# Patient Record
Sex: Male | Born: 1955 | Race: White | Hispanic: No | Marital: Single | Smoking: Never smoker
Health system: Southern US, Community
[De-identification: ages and names within clinical notes are randomized; demographics above are authoritative.]

## PROBLEM LIST (undated history)

## (undated) DIAGNOSIS — I10 Essential (primary) hypertension: Secondary | ICD-10-CM

## (undated) HISTORY — PX: TONSILLECTOMY: SUR1361

---

## 2012-10-17 ENCOUNTER — Emergency Department (INDEPENDENT_AMBULATORY_CARE_PROVIDER_SITE_OTHER): Payer: Self-pay

## 2012-10-17 ENCOUNTER — Encounter: Payer: Self-pay | Admitting: *Deleted

## 2012-10-17 ENCOUNTER — Emergency Department
Admission: EM | Admit: 2012-10-17 | Discharge: 2012-10-17 | Disposition: A | Discharge status: Home / Self Care | Payer: Self-pay | Source: Home / Self Care | Attending: Family Medicine | Admitting: Family Medicine

## 2012-10-17 DIAGNOSIS — R05 Cough: Secondary | ICD-10-CM

## 2012-10-17 DIAGNOSIS — R5383 Other fatigue: Secondary | ICD-10-CM

## 2012-10-17 DIAGNOSIS — T594X1A Toxic effect of chlorine gas, accidental (unintentional), initial encounter: Secondary | ICD-10-CM

## 2012-10-17 DIAGNOSIS — T594X4A Toxic effect of chlorine gas, undetermined, initial encounter: Secondary | ICD-10-CM

## 2012-10-17 DIAGNOSIS — Z77098 Contact with and (suspected) exposure to other hazardous, chiefly nonmedicinal, chemicals: Secondary | ICD-10-CM

## 2012-10-17 HISTORY — DX: Essential (primary) hypertension: I10

## 2012-10-17 MED ORDER — ALBUTEROL SULFATE HFA 108 (90 BASE) MCG/ACT IN AERS: 2.0000 | INHALATION_SPRAY | RESPIRATORY_TRACT | Status: DC | PRN

## 2012-10-17 MED ORDER — PREDNISONE 50 MG PO TABS: ORAL_TABLET | ORAL | Status: DC

## 2012-10-17 NOTE — ED Provider Notes (Addendum)
History     CSN: [id number]  Arrival date & time 10/17/12  1154   First MD Initiated Contact with Patient 10/17/12 1202      Chief Complaint  Patient presents with  . Sore Throat   HPI  This is a 57 year old male with no prior medical history apart from tonsillectomy and high blood pressure presenting today with multiple symptoms status post chlorine exposure. Patient works in Education officer, environmental. Patient states he had a very high amounts of chlorine vapor exposure about one week ago. Chloride was industrial-strength per patient. Immediately after the exposure, patient states he fell on However within about 24 hours he noticed severe sore throat, headache, generalized malaise, fatigue, mild shortness of breath. Symptoms have persisted over the course of the past week and had failed to improve. Patient reports severe onset of headache as diffuse in nature. Pain is 10 out of 10 and occurs multiple times throughout the day. Patient also reports severe sore throat with mild difficulty swallowing however no difficulty breathing. Patient does have mild cough is now report of any significant wheezing however patient is unsure. Patient is a nonsmoker. Past Medical History  Diagnosis Date  . Hypertension     Past Surgical History  Procedure Laterality Date  . Tonsillectomy      Family History  Problem Relation Age of Onset  . Heart attack Father   . Diabetes Father     History  Substance Use Topics  . Smoking status: Never Smoker   . Smokeless tobacco: Never Used  . Alcohol Use: No      Review of Systems  All other systems reviewed and are negative.    Allergies  Codeine  Home Medications   Current Outpatient Rx  Name  Route  Sig  Dispense  Refill  . lisinopril (PRINIVIL,ZESTRIL) 20 MG tablet   Oral   Take 20 mg by mouth daily.           BP 118/78  Pulse 88  Temp(Src) 98.4 F (36.9 C) (Oral)  Resp 16  Ht 5\' 3"  (1.6 m)  Wt 167 lb (75.751 kg)  BMI 29.59 kg/m2   SpO2 96%  Physical Exam  Constitutional: He appears well-developed and well-nourished.  HENT:  Head: Normocephalic and atraumatic.  Right Ear: External ear normal.  Left Ear: External ear normal.  Eyes: Conjunctivae are normal. Pupils are equal, round, and reactive to light.  Neck: Normal range of motion. Neck supple.  Cardiovascular: Normal rate and regular rhythm.   Pulmonary/Chest: Effort normal and breath sounds normal.  Abdominal: Soft.  Musculoskeletal: Normal range of motion.  Neurological: He is alert.  Skin: Skin is warm.    ED Course  Procedures (including critical care time)  Labs Reviewed - No data to display No results found.   1. Toxic effect of chlorine gas, unintentional, initial encounter       MDM  Discussed case with poison control. Initial management is supportive care. Suspect this is a fairly high level chlorine gas exposure as chlorine was commercial grade. Discussed with patient obtaining and a chest x-ray as well as a head CT. Patient is uninsured and refused these tests. Discussed with patient importance of these tests given chlorine exposure. Patient states he can't afford these tests. Will prescribe prednisone as well as albuterol as this may give some symptomatic improvement. Discussed this with poison control. Discussed with patient that if his symptoms worsen in any regard they will need to be reevaluated for symptoms other here in  the ER. Patient expressed understanding of this.       The patient and/or caregiver has been counseled thoroughly with regard to treatment plan and/or medications prescribed including dosage, schedule, interactions, rationale for use, and possible side effects and they verbalize understanding. Diagnoses and expected course of recovery discussed and will return if not improved as expected or if the condition worsens. Patient and/or caregiver verbalized understanding.               Doree Albee,  MD 10/17/12 1254  Medical update: Patient is now agreeable to getting chest x-ray. Will obtain a followup pending results.  Doree Albee, MD 10/17/12 1307  CXR WNL.  Complete treatment course. Follow up or go to ER if sxs worsen.    Doree Albee, MD 10/17/12 [phone]

## 2012-10-17 NOTE — ED Notes (Signed)
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 Since, he c/o sore throat, HA, fatigue and loss of appetite. Unsure of fever but has felt "hot".

## 2012-10-22 ENCOUNTER — Telehealth: Payer: Self-pay | Admitting: Emergency Medicine

## 2013-03-06 ENCOUNTER — Ambulatory Visit (INDEPENDENT_AMBULATORY_CARE_PROVIDER_SITE_OTHER): Payer: Self-pay | Admitting: Family Medicine

## 2013-03-06 ENCOUNTER — Encounter: Payer: Self-pay | Admitting: Family Medicine

## 2013-03-06 VITALS — BP 124/84 | HR 78 | Ht 63.0 in | Wt 169.0 lb

## 2013-03-06 DIAGNOSIS — I10 Essential (primary) hypertension: Secondary | ICD-10-CM | POA: Insufficient documentation

## 2013-03-06 MED ORDER — LISINOPRIL 20 MG PO TABS: 20.0000 mg | ORAL_TABLET | Freq: Every day | ORAL | Status: DC

## 2013-03-06 NOTE — Progress Notes (Signed)
CC: Joel Matthews is a 57 y.o. 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 After diagnosis of hypertension he has made drastic change in diet cutting out all fast food no longer adding salt to his diet sticking to a bland diet with lean meats.  He has a blood pressure cuff at home but no outside blood pressures to report. He believes lisinopril to help with blood pressure soon after starting.  Review of Systems - General ROS: negative for - chills, fever, night sweats, weight gain or weight loss Ophthalmic ROS: negative for - decreased vision Psychological ROS: negative for - anxiety or depression ENT ROS: negative for - hearing change, nasal congestion, tinnitus or allergies Hematological and Lymphatic ROS: negative for - bleeding problems, bruising or swollen lymph nodes Breast ROS: negative Respiratory ROS: no cough, shortness of breath, or wheezing Cardiovascular ROS: no chest pain or dyspnea on exertion Gastrointestinal ROS: no abdominal pain, change in bowel habits, or black or bloody stools Genito-Urinary ROS: negative for - genital discharge, genital ulcers, incontinence or abnormal bleeding from genitals Musculoskeletal ROS: negative for - joint pain or muscle pain Neurological ROS: negative for - headaches or memory loss Dermatological ROS: negative for lumps, mole changes, rash and skin lesion changes  Past Medical History  Diagnosis Date  . Hypertension      Family History  Problem Relation Age of Onset  . Heart attack Father   . Diabetes Father      History  Substance Use Topics  . Smoking status: Never Smoker   . Smokeless tobacco: Never Used  . Alcohol Use: No     Objective: Filed Vitals:   03/06/13 1100  BP: 124/84  Pulse:  78    General: Alert and Oriented, No Acute Distress HEENT: Pupils equal, round, reactive to light. Conjunctivae clear.  Moist membranes pharynx unremarkable Lungs: Clear to auscultation bilaterally, no wheezing/ronchi/rales.  Comfortable work of breathing. Good air movement. Cardiac: Regular rate and rhythm. Normal S1/S2.  No murmurs, rubs, nor gallops.   Extremities: No peripheral edema.  Strong peripheral pulses.  Mental Status: No depression, anxiety, nor agitation. Skin: Warm and dry.  Assessment & Plan: Joel Matthews was seen today for establish care.  Diagnoses and associated orders for this visit:  Essential hypertension, benign - lisinopril (PRINIVIL,ZESTRIL) 20 MG tablet; Take 1 tablet (20 mg total) by mouth daily.    Essential hypertension: Controlled with diet, manual blood pressure again reports normotensive. We discussed restarting lisinopril if blood pressure climbs above 140/90 but for now do not think he needs to fill this prescription. He will check blood pressure at home periodically. He will return when he has benefits to do routine blood work and preventative health care  Return in about 3 months (around 06/06/2013).

## 2013-06-03 ENCOUNTER — Other Ambulatory Visit: Payer: Self-pay | Admitting: *Deleted

## 2013-06-03 DIAGNOSIS — I10 Essential (primary) hypertension: Secondary | ICD-10-CM

## 2013-06-03 MED ORDER — LISINOPRIL 20 MG PO TABS: 20.0000 mg | ORAL_TABLET | Freq: Every day | ORAL | Status: DC

## 2013-10-30 ENCOUNTER — Telehealth: Payer: Self-pay | Admitting: *Deleted

## 2013-10-30 DIAGNOSIS — I10 Essential (primary) hypertension: Secondary | ICD-10-CM

## 2013-10-30 MED ORDER — LISINOPRIL 20 MG PO TABS: 20.0000 mg | ORAL_TABLET | Freq: Every day | ORAL | Status: DC

## 2013-10-30 NOTE — Telephone Encounter (Signed)
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 Pt wants to know what he can take. Advised him to try otc melatonin first and if no relief to schedule appt. Pt also asked for a refill on BP meds. Told pt we will send in 1 refill but needs to schedule f/u. Pt voiced understanding

## 2014-03-24 ENCOUNTER — Other Ambulatory Visit: Payer: Self-pay | Admitting: Family Medicine

## 2014-06-02 ENCOUNTER — Ambulatory Visit: Payer: Self-pay | Admitting: Family Medicine

## 2014-06-09 ENCOUNTER — Ambulatory Visit (INDEPENDENT_AMBULATORY_CARE_PROVIDER_SITE_OTHER): Payer: Self-pay | Admitting: Family Medicine

## 2014-06-09 ENCOUNTER — Encounter: Payer: Self-pay | Admitting: Family Medicine

## 2014-06-09 VITALS — BP 140/93 | HR 75 | Wt 175.0 lb

## 2014-06-09 DIAGNOSIS — R635 Abnormal weight gain: Secondary | ICD-10-CM

## 2014-06-09 DIAGNOSIS — I10 Essential (primary) hypertension: Secondary | ICD-10-CM

## 2014-06-09 MED ORDER — LISINOPRIL 20 MG PO TABS: 20.0000 mg | ORAL_TABLET | Freq: Every day | ORAL | Status: DC

## 2014-06-09 MED ORDER — PHENTERMINE HCL 37.5 MG PO TABS: 37.5000 mg | ORAL_TABLET | Freq: Every day | ORAL | Status: DC

## 2014-06-09 NOTE — Progress Notes (Signed)
CC: Joel Matthews is a 59 y.o. male is here for Hypertension   Subjective: HPI:  Follow-up essential hypertension: Since I saw him last he noticed blood pressures above 140/90 therefore started taking lisinopril. No outside blood pressures to report after he began this. He's run out of the medication a few days ago. Denies any known side effects while taking the medication. No formal physical activity routine. No chest pain shortness of breath orthopnea peripheral edema nor motor or sensory disturbances  Complains of difficulty with weight loss. He is about to join a gym but notices his biggest problem is portion control. He gives me an example of last week he ate 6 pieces of chicken along with a chicken sandwich and french fries and a single sitting. He reports it's rare that he feels full and he responds to this by continuing to eat.   Review Of Systems Outlined In HPI  Past Medical History  Diagnosis Date  . Hypertension     Past Surgical History  Procedure Laterality Date  . Tonsillectomy     Family History  Problem Relation Age of Onset  . Heart attack Father   . Diabetes Father     History   Social History  . Marital Status: Single    Spouse Name: N/A    Number of Children: N/A  . Years of Education: N/A   Occupational History  . Not on file.   Social History Main Topics  . Smoking status: Never Smoker   . Smokeless tobacco: Never Used  . Alcohol Use: No  . Drug Use: No  . Sexual Activity: Not on file   Other Topics Concern  . Not on file   Social History Narrative     Objective: BP 140/93 mmHg  Pulse 75  Wt 175 lb (79.379 kg)  General: Alert and Oriented, No Acute Distress HEENT: Pupils equal, round, reactive to light. Conjunctivae clear.  Moist because membranes sounds unremarkable Lungs: Clear to auscultation bilaterally, no wheezing/ronchi/rales.  Comfortable work of breathing. Good air movement. Cardiac: Regular rate and rhythm. Normal S1/S2.  No  murmurs, rubs, nor gallops.   Abdomen: Mild obesity Extremities: No peripheral edema.  Strong peripheral pulses.  Mental Status: No depression, anxiety, nor agitation. Skin: Warm and dry.  Assessment & Plan: Joel Matthews was seen today for hypertension.  Diagnoses and associated orders for this visit:  Essential hypertension, benign - lisinopril (PRINIVIL,ZESTRIL) 20 MG tablet; Take 1 tablet (20 mg total) by mouth daily.  Abnormal weight gain - phentermine (ADIPEX-P) 37.5 MG tablet; Take 1 tablet (37.5 mg total) by mouth daily before breakfast.    Essential hypertension: Uncontrolled, restart lisinopril Abnormal weight gain: Provided with phentermine to help with portion control. Urged him to continue and follow through with joining the gym to do moderate physical activity most days a week. Discussed that he'll need to return every 4 weeks for a blood pressure and weight check if he is considering a refill for phentermine. Also discussed that this medication at some point will lose its effectiveness with weight loss at that point will no longer be prescribed.  Return in about 4 weeks (around 07/07/2014) for BP and Weight Check.

## 2014-07-07 ENCOUNTER — Ambulatory Visit (INDEPENDENT_AMBULATORY_CARE_PROVIDER_SITE_OTHER): Payer: Self-pay | Admitting: Family Medicine

## 2014-07-07 ENCOUNTER — Encounter: Payer: Self-pay | Admitting: Family Medicine

## 2014-07-07 VITALS — BP 94/65 | HR 62 | Ht 63.0 in | Wt 169.0 lb

## 2014-07-07 DIAGNOSIS — E669 Obesity, unspecified: Secondary | ICD-10-CM

## 2014-07-07 DIAGNOSIS — R635 Abnormal weight gain: Secondary | ICD-10-CM

## 2014-07-07 DIAGNOSIS — I10 Essential (primary) hypertension: Secondary | ICD-10-CM

## 2014-07-07 MED ORDER — PHENTERMINE HCL 37.5 MG PO TABS: 37.5000 mg | ORAL_TABLET | Freq: Every day | ORAL | Status: DC

## 2014-07-07 NOTE — Progress Notes (Signed)
CC: Rosana BergerJohn Hemberger is a 59 y.o. male is here for Follow-up   Subjective: HPI:   Follow-up essential hypertension: Continues on lisinopril 20 mg daily. No outside blood pressures reported. Denies chest pain shortness of breath orthopnea nor peripheral edema. He's been running a quarter of a mile a few times a day for a formal exercise routine. He's been trying to cut back on sodium in his diet.  Follow-up obesity: States that phentermine has helped tremendously with portion control. No longer overindulging in any food. He's been focusing on a diet that is predominantly based on fish and chicken trying to focus on lean meat. He has not noticed any weight loss and his abdomen but has gone down a size in his waist.denies any known side effects. Denies anxiety paranoia nor sleep disturbance   Review Of Systems Outlined In HPI  Past Medical History  Diagnosis Date  . Hypertension     Past Surgical History  Procedure Laterality Date  . Tonsillectomy     Family History  Problem Relation Age of Onset  . Heart attack Father   . Diabetes Father     History   Social History  . Marital Status: Single    Spouse Name: N/A    Number of Children: N/A  . Years of Education: N/A   Occupational History  . Not on file.   Social History Main Topics  . Smoking status: Never Smoker   . Smokeless tobacco: Never Used  . Alcohol Use: No  . Drug Use: No  . Sexual Activity: Not on file   Other Topics Concern  . Not on file   Social History Narrative     Objective: BP 94/65 mmHg  Pulse 62  Ht 5\' 3"  (1.6 m)  Wt 169 lb (76.658 kg)  BMI 29.94 kg/m2  General: Alert and Oriented, No Acute Distress HEENT: Pupils equal, round, reactive to light. Conjunctivae clear.  Moist mucous membranes Lungs: Clear to auscultation bilaterally, no wheezing/ronchi/rales.  Comfortable work of breathing. Good air movement. Cardiac: Regular rate and rhythm. Normal S1/S2.  No murmurs, rubs, nor gallops.   Abdomen:  mild obesity Extremities: No peripheral edema.  Strong peripheral pulses.  Mental Status: No depression, anxiety, nor agitation. Skin: Warm and dry.  Assessment & Plan: Jonny RuizJohn was seen today for follow-up.  Diagnoses and associated orders for this visit:  Essential hypertension, benign  Obesity  Abnormal weight gain - phentermine (ADIPEX-P) 37.5 MG tablet; Take 1 tablet (37.5 mg total) by mouth daily before breakfast.    Essential hypertension: Controlled continue lisinopril Abnormal weight gain and obesity: Improving with phentermine use. Encouraged him to continue with exercise and dietary interventions and that phentermine will be provided if he continues to lose weight however refills will not be available if he plateaus with his weight.  Return in about 4 weeks (around 08/04/2014).

## 2014-08-04 ENCOUNTER — Ambulatory Visit: Payer: Self-pay | Admitting: Family Medicine

## 2015-03-24 ENCOUNTER — Other Ambulatory Visit: Payer: Self-pay | Admitting: Family Medicine

## 2015-03-27 ENCOUNTER — Ambulatory Visit (INDEPENDENT_AMBULATORY_CARE_PROVIDER_SITE_OTHER): Payer: Self-pay | Admitting: Family Medicine

## 2015-03-27 ENCOUNTER — Encounter: Payer: Self-pay | Admitting: Family Medicine

## 2015-03-27 VITALS — BP 134/87 | HR 72 | Wt 169.0 lb

## 2015-03-27 DIAGNOSIS — I10 Essential (primary) hypertension: Secondary | ICD-10-CM

## 2015-03-27 MED ORDER — LISINOPRIL 20 MG PO TABS: 20.0000 mg | ORAL_TABLET | Freq: Every day | ORAL | Status: DC

## 2015-03-27 NOTE — Progress Notes (Signed)
CC: Joel BergerJohn Matthews is a 59 y.o. male is here for Hypertension and Medication Refill   Subjective: HPI:  Follow-up essential hypertension: Continues to take lisinopril on a daily basis. Requesting refills today. Denies cough or angioedema. Denies any known side effect. Denies chest pain shortness of breath orthopnea nor peripheral edema. Trying to eat healthy. He should have insurance in a few weeks with [employer].   Review Of Systems Outlined In HPI  Past Medical History  Diagnosis Date  . Hypertension     Past Surgical History  Procedure Laterality Date  . Tonsillectomy     Family History  Problem Relation Age of Onset  . Heart attack Father   . Diabetes Father     Social History   Social History  . Marital Status: Single    Spouse Name: N/A  . Number of Children: N/A  . Years of Education: N/A   Occupational History  . Not on file.   Social History Main Topics  . Smoking status: Never Smoker   . Smokeless tobacco: Never Used  . Alcohol Use: No  . Drug Use: No  . Sexual Activity: Not on file   Other Topics Concern  . Not on file   Social History Narrative     Objective: BP 134/87 mmHg  Pulse 72  Wt 169 lb (76.658 kg)  General: Alert and Oriented, No Acute Distress HEENT: Pupils equal, round, reactive to light. Conjunctivae clear.  Moist mucous membranes Lungs: Clear to auscultation bilaterally, no wheezing/ronchi/rales.  Comfortable work of breathing. Good air movement. Cardiac: Regular rate and rhythm. Normal S1/S2.  No murmurs, rubs, nor gallops.   Extremities: No peripheral edema.  Strong peripheral pulses.  Mental Status: No depression, anxiety, nor agitation.requires frequent redirection  Skin: Warm and dry.  Assessment & Plan: Joel RuizJohn was seen today for hypertension and medication refill.  Diagnoses and all orders for this visit:  Essential hypertension -     lisinopril (PRINIVIL,ZESTRIL) 20 MG tablet; Take 1 tablet (20 mg total) by mouth  daily.   Essential hypertension: Controlled continue lisinopril, when he gets insurance I would like to get a basic metabolic panel. Financially he would not be able to afford this  25 minutes spent face-to-face during visit today of which at least 50% was counseling or coordinating care regarding: 1. Essential hypertension       Return in about 6 months (around 09/25/2015).

## 2016-01-29 ENCOUNTER — Other Ambulatory Visit: Payer: Self-pay | Admitting: Family Medicine

## 2016-01-29 DIAGNOSIS — I10 Essential (primary) hypertension: Secondary | ICD-10-CM

## 2016-03-24 ENCOUNTER — Other Ambulatory Visit: Payer: Self-pay | Admitting: *Deleted

## 2016-03-24 DIAGNOSIS — I10 Essential (primary) hypertension: Secondary | ICD-10-CM

## 2016-03-24 MED ORDER — LISINOPRIL 20 MG PO TABS: 20.0000 mg | ORAL_TABLET | Freq: Every day | ORAL | 0 refills | Status: DC

## 2016-04-12 ENCOUNTER — Ambulatory Visit (INDEPENDENT_AMBULATORY_CARE_PROVIDER_SITE_OTHER): Payer: BLUE CROSS/BLUE SHIELD | Admitting: Osteopathic Medicine

## 2016-04-12 ENCOUNTER — Encounter: Payer: Self-pay | Admitting: Osteopathic Medicine

## 2016-04-12 VITALS — BP 143/92 | HR 68 | Ht 63.0 in | Wt 173.0 lb

## 2016-04-12 DIAGNOSIS — Z23 Encounter for immunization: Secondary | ICD-10-CM | POA: Diagnosis not present

## 2016-04-12 DIAGNOSIS — N50812 Left testicular pain: Secondary | ICD-10-CM | POA: Diagnosis not present

## 2016-04-12 DIAGNOSIS — Z0000 Encounter for general adult medical examination without abnormal findings: Secondary | ICD-10-CM

## 2016-04-12 DIAGNOSIS — R7309 Other abnormal glucose: Secondary | ICD-10-CM

## 2016-04-12 DIAGNOSIS — I10 Essential (primary) hypertension: Secondary | ICD-10-CM

## 2016-04-12 LAB — CBC WITH DIFFERENTIAL/PLATELET
BASOS PCT: 0 %
Basophils Absolute: 0 cells/uL (ref 0–200)
EOS PCT: 3 %
Eosinophils Absolute: 153 cells/uL (ref 15–500)
HCT: 45.4 % (ref 38.5–50.0)
Hemoglobin: 14.9 g/dL (ref 13.2–17.1)
Lymphocytes Relative: 28 %
Lymphs Abs: 1428 cells/uL (ref 850–3900)
MCH: 28.6 pg (ref 27.0–33.0)
MCHC: 32.8 g/dL (ref 32.0–36.0)
MCV: 87.1 fL (ref 80.0–100.0)
MONOS PCT: 10 %
MPV: 10.3 fL (ref 7.5–12.5)
Monocytes Absolute: 510 cells/uL (ref 200–950)
NEUTROS ABS: 3009 {cells}/uL (ref 1500–7800)
Neutrophils Relative %: 59 %
PLATELETS: 246 10*3/uL (ref 140–400)
RBC: 5.21 MIL/uL (ref 4.20–5.80)
RDW: 13.9 % (ref 11.0–15.0)
WBC: 5.1 10*3/uL (ref 3.8–10.8)

## 2016-04-12 LAB — COMPLETE METABOLIC PANEL WITH GFR
ALT: 13 U/L (ref 9–46)
AST: 14 U/L (ref 10–35)
Albumin: 4 g/dL (ref 3.6–5.1)
Alkaline Phosphatase: 60 U/L (ref 40–115)
BILIRUBIN TOTAL: 1 mg/dL (ref 0.2–1.2)
BUN: 17 mg/dL (ref 7–25)
CHLORIDE: 106 mmol/L (ref 98–110)
CO2: 19 mmol/L — AB (ref 20–31)
CREATININE: 1.04 mg/dL (ref 0.70–1.25)
Calcium: 8.8 mg/dL (ref 8.6–10.3)
GFR, Est African American: 89 mL/min (ref >=60)
GFR, Est Non African American: 77 mL/min (ref >=60)
GLUCOSE: 124 mg/dL — AB (ref 65–99)
Potassium: 4.2 mmol/L (ref 3.5–5.3)
SODIUM: 136 mmol/L (ref 135–146)
TOTAL PROTEIN: 6.2 g/dL (ref 6.1–8.1)

## 2016-04-12 LAB — LIPID PANEL
Cholesterol: 227 mg/dL — ABNORMAL HIGH (ref <200)
HDL: 43 mg/dL (ref >40)
LDL CALC: 149 mg/dL — AB
Total CHOL/HDL Ratio: 5.3 Ratio — ABNORMAL HIGH (ref <5.0)
Triglycerides: 173 mg/dL — ABNORMAL HIGH (ref <150)
VLDL: 35 mg/dL — AB (ref <30)

## 2016-04-12 MED ORDER — LISINOPRIL 20 MG PO TABS: 20.0000 mg | ORAL_TABLET | Freq: Every day | ORAL | 0 refills | Status: DC

## 2016-04-12 NOTE — Progress Notes (Signed)
HPI: Joel BergerJohn Matthews is a 60 y.o. male  who [facility] today, 04/12/16,  for chief complaint of:  Chief Complaint  Patient presents with  . Annual Exam    Switching from [place]     Patient seen here for annual physical exam. See below for review of preventive care.   Only complaint today is testicular pain ongoing for about 2 weeks, worse on the left, initially patient could not recall injury but then thought perhaps his dog head accidentally jumped on him in her that area a few weeks ago. Not sexually active over the past several years. No recent ejaculation.    Past medical, surgical, social and family history reviewed: Past Medical History:  Diagnosis Date  . Hypertension    Past Surgical History:  Procedure Laterality Date  . TONSILLECTOMY     Social History  Substance Use Topics  . Smoking status: Never Smoker  . Smokeless tobacco: Never Used  . Alcohol use No   Family History  Problem Relation Age of Onset  . Heart attack Father   . Diabetes Father      Current medication list and allergy/intolerance information reviewed:   Current Outpatient Prescriptions  Medication Sig Dispense Refill  . lisinopril (PRINIVIL,ZESTRIL) 20 MG tablet Take 1 tablet (20 mg total) by mouth daily. NEED FOLLOW UP APPOINTMENT FOR MORE REFILLS 15 tablet 0   No current facility-administered medications for this visit.    Allergies  Allergen Reactions  . Codeine       Review of Systems:  Constitutional:  No  fever, no chills, No recent illness, No unintentional weight changes. No significant fatigue.   HEENT: No  headache, no vision change, no hearing change  Cardiac: No  chest pain, No  pressure, No palpitations  Respiratory:  No  shortness of breath. No  Cough  Gastrointestinal: No  abdominal pain, No  nausea, No  vomiting  Musculoskeletal: No new myalgia/arthralgia  Genitourinary: No  incontinence, No  abnormal genital  bleeding, No abnormal genital discharge, +testicular pain worse on L  Skin: No  Rash, No other wounds/concerning lesions  Hem/Onc: No  easy bruising/bleeding, No  abnormal lymph node  Endocrine: No cold intolerance,  No heat intolerance. No polyuria/polydipsia/polyphagia   Neurologic: No  weakness, No  dizziness, No  slurred speech/focal weakness/facial droop  Psychiatric: No  concerns with depression, No  concerns with anxiety, No sleep problems, No mood problems  Exam:  BP (!) 143/92   Pulse 68   Ht 5\' 3"  (1.6 m)   Wt 173 lb (78.5 kg)   BMI 30.65 kg/m   Constitutional: VS see above. General Appearance: alert, well-developed, well-nourished, NAD  Eyes: Normal lids and conjunctive, non-icteric sclera  Ears, Nose, Mouth, Throat: MMM, Normal external inspection ears/nares/mouth/lips/gums. TM normal bilaterally. Pharynx/tonsils no erythema, no exudate. Nasal mucosa normal.   Neck: No masses, trachea midline. No thyroid enlargement. No tenderness/mass appreciated. No lymphadenopathy.   Respiratory: Normal respiratory effort. no wheeze, no rhonchi, no rales  Cardiovascular: S1/S2 normal, no murmur, no rub/gallop auscultated. RRR. No lower extremity edema.  Gastrointestinal: Nontender, no masses. No hepatomegaly, no splenomegaly. No hernia appreciated. Bowel sounds normal. Rectal exam deferred.   GU: normal testicular exam, no palpable nodule or tenderness, no enlarged epididymis, no rash or drainage, no hydrocele  Musculoskeletal: Gait normal. No clubbing/cyanosis of digits.   Neurological: Normal balance/coordination. No tremor. No cranial nerve deficit on limited exam. Motor and sensation intact and symmetric. Cerebellar  reflexes intact.   Skin: warm, dry, intact. No rash/ulcer. Marland Kitchen.    Psychiatric: Normal judgment/insight. Normal mood and affect. Oriented x3.      ASSESSMENT/PLAN:   Annual physical exam - Plan: Tdap vaccine greater than or equal to 7yo IM, Cologuard, CBC  with Differential/Platelet, COMPLETE METABOLIC PANEL WITH GFR, Lipid panel  Essential hypertension - Plan: lisinopril (PRINIVIL,ZESTRIL) 20 MG tablet  Testicular pain, left - Normal exam. Patient states his dog may have injured him in that area. Ultrasound/urology referral if persists or worsens    MALE PREVENTIVE CARE  updated 04/12/16  ANNUAL SCREENING/COUNSELING  Any changes to health in the past year? Nothing major  Diet/Exercise - HEALTHY HABITS DISCUSSED TO DECREASE CV RISK  Never smoker   No alcohol use  Domestic violence concerns - no  HTN SCREENING - SEE VITALS  SEXUAL/REPRODUCTIVE HEALTH  Sexually active in the past year? - No   STI testing needed/desired today? - no  Any concerns with testosterone/libido? - no  INFECTIOUS DISEASE SCREENING  HIV - does not need  GC/CT - does not need  HepC - does not need  TB - does not need  CANCER SCREENING  Lung - USPSTF: 55-80yo w/ 30 py hx unless quit w/in 4464yr - does not need  Colon - needs - opts for cologuard  Prostate - does not need - would like to research this a bit more   OTHER DISEASE SCREENING  Lipid - needs  DM2 - needs  AAA - 65-75yo ever smoked: does not need  Osteoporosis - men 61yo+ - does not need  Fracture after age 61? no  Chronic steroid use? no  Smoking/Alcohol? no  Low body weight <127lb? no  Hip fracture in parent? no  Malabsorbtion, CLD, IBD, RA? no  ADULT VACCINATION  Influenza - annual vaccine recommended - declined  Td - booster every 10 years - given today   Zoster - option at 3950, yes at 60+   PCV13 - was not indicated  PPSV23 - was not indicated Immunization History  Administered Date(s) Administered  . Tdap 04/12/2016   OTHER  Fall - exercise and Vit D age 57+ - does not need  Consider ASA - age 61-59 - does not need    Patient Instructions  PSA - prostate specific antigen (prostate cancer screening)  If testicular pain persists or gets worse,  let me know. We should get an ultrasound and/or refer to a urologist if this isn't getting better.    Visit summary with medication list and pertinent instructions was printed for patient to review. All questions at time of visit were answered - patient instructed to contact office with any additional concerns. ER/RTC precautions were reviewed with the patient. Follow-up plan: Return in about 6 months (around 10/10/2016) for blood pressure check, sooner if needed.

## 2016-04-12 NOTE — Patient Instructions (Addendum)
PSA - prostate specific antigen (prostate cancer screening)  If testicular pain persists or gets worse, let me know. We should get an ultrasound and/or refer to a urologist if this isn't getting better.

## 2016-04-13 NOTE — Addendum Note (Signed)
Addended by: Deirdre PippinsALEXANDER, Corney Knighton M on: 04/13/2016 07:32 AM   Modules accepted: Orders

## 2016-04-19 LAB — HEMOGLOBIN A1C
Hgb A1c MFr Bld: 5.5 % (ref <5.7)
MEAN PLASMA GLUCOSE: 111 mg/dL

## 2016-04-21 ENCOUNTER — Encounter: Payer: Self-pay | Admitting: Osteopathic Medicine

## 2016-11-03 ENCOUNTER — Other Ambulatory Visit: Payer: Self-pay | Admitting: Osteopathic Medicine

## 2016-11-03 DIAGNOSIS — I10 Essential (primary) hypertension: Secondary | ICD-10-CM

## 2016-11-03 NOTE — Telephone Encounter (Signed)
Patient called in to request a refill on his lisinopril. He stated he ran out two days ago. Please advise. Thanks!

## 2016-11-04 ENCOUNTER — Other Ambulatory Visit: Payer: Self-pay

## 2016-11-04 DIAGNOSIS — I10 Essential (primary) hypertension: Secondary | ICD-10-CM

## 2016-11-04 MED ORDER — LISINOPRIL 20 MG PO TABS: 20.0000 mg | ORAL_TABLET | Freq: Every day | ORAL | 0 refills | Status: DC

## 2016-11-04 NOTE — Telephone Encounter (Signed)
Patient [facility] #[facility]. He requested a 90 day supply due to me scheduling appointment but he verbally understood that he needs to keep his upcoming appointment scheduled for June 7,2018 @ 7:10 am. Estelle Junehonda Reid Nawrot,CMA

## 2016-11-10 ENCOUNTER — Ambulatory Visit (INDEPENDENT_AMBULATORY_CARE_PROVIDER_SITE_OTHER): Payer: BLUE CROSS/BLUE SHIELD | Admitting: Osteopathic Medicine

## 2016-11-10 ENCOUNTER — Encounter: Payer: Self-pay | Admitting: Osteopathic Medicine

## 2016-11-10 DIAGNOSIS — I10 Essential (primary) hypertension: Secondary | ICD-10-CM | POA: Diagnosis not present

## 2016-11-10 MED ORDER — LISINOPRIL 20 MG PO TABS: 20.0000 mg | ORAL_TABLET | Freq: Every day | ORAL | 3 refills | Status: DC

## 2016-11-10 NOTE — Patient Instructions (Signed)
   Plan to return for nurse visit to verify home blood pressure cuff. In the meantime, be keeping a record of you rblood pressures at home and bring this with you to the visit with the nurse.   If your cuff is measuring within 5-10 points of ours AND your home numbers are less than 130/80, then nothing else to do.   If your home blood pressure cuff is inaccurate or is accurate but measuring above goal, we will need to talk about adjusting your medications.

## 2016-11-10 NOTE — Progress Notes (Signed)
HPI: Joel BergerJohn Matthews is a 61 y.o. male  who presents to [facility] today, 11/10/16,  for chief complaint of:  Chief Complaint  Patient presents with  . Follow-up    BLOOD PRESSURE    HTN: has been out of meds for a few days, BP above goal right now. No CP/SOB, no HA/VC. States home BP is typically S: 120-130s, D: 80s. Home BP cuff not verified in office.    Past medical history, surgical history, social history and family history reviewed.  Patient Active Problem List   Diagnosis Date Noted  . Testicular pain, left 04/12/2016  . Obesity 07/07/2014  . Essential hypertension, benign 03/06/2013    Current medication list and allergy/intolerance information reviewed.   Current Outpatient Prescriptions on File Prior to Visit  Medication Sig Dispense Refill  . lisinopril (PRINIVIL,ZESTRIL) 20 MG tablet Take 1 tablet (20 mg total) by mouth daily. MUST KEEP APPOINTMENT 90 tablet 0   No current facility-administered medications on file prior to visit.    Allergies  Allergen Reactions  . Codeine       Review of Systems:  Constitutional: No recent illness  HEENT: No  headache, no vision change  Cardiac: No  chest pain, No  pressure, No palpitations  Respiratory:  No  shortness of breath.  Neurologic: No  weakness, No  Dizziness  Exam:  BP (!) 148/97   Pulse 66   Ht 5\' 3"  (1.6 m)   Wt 180 lb (81.6 kg)   BMI 31.89 kg/m   Constitutional: VS see above. General Appearance: alert, well-developed, well-nourished, NAD  Eyes: Normal lids and conjunctive, non-icteric sclera  Ears, Nose, Mouth, Throat: MMM, Normal external inspection ears/nares/mouth/lips/gums.  Neck: No masses, trachea midline.   Respiratory: Normal respiratory effort. no wheeze, no rhonchi, no rales  Cardiovascular: S1/S2 normal, no murmur, no rub/gallop auscultated. RRR.   Musculoskeletal: Gait normal. Symmetric and independent movement of all extremities  Neurological:  Normal balance/coordination. No tremor.  Skin: warm, dry, intact.   Psychiatric: Normal judgment/insight. Normal mood and affect. Oriented x3.      ASSESSMENT/PLAN:   Essential hypertension - Plan: lisinopril (PRINIVIL,ZESTRIL) 20 MG tablet    Patient Instructions   Plan to return for nurse visit to verify home blood pressure cuff. In the meantime, be keeping a record of you rblood pressures at home and bring this with you to the visit with the nurse.   If your cuff is measuring within 5-10 points of ours AND your home numbers are less than 130/80, then nothing else to do.   If your home blood pressure cuff is inaccurate or is accurate but measuring above goal, we will need to talk about adjusting your medications.       Follow-up plan: Return in about 6 months (around 05/12/2017) for ANNUAL PHYSICAL .  Visit summary with medication list and pertinent instructions was printed for patient to review, alert us if any changes needed. All questions at time of visit were answered - patient instructed to contact office with any additional concerns. ER/RTC precautions were reviewed with the patient and understanding verbalized.

## 2016-11-11 ENCOUNTER — Emergency Department (HOSPITAL_BASED_OUTPATIENT_CLINIC_OR_DEPARTMENT_OTHER): Payer: Worker's Compensation

## 2016-11-11 ENCOUNTER — Emergency Department (HOSPITAL_BASED_OUTPATIENT_CLINIC_OR_DEPARTMENT_OTHER)
Admission: EM | Admit: 2016-11-11 | Discharge: 2016-11-11 | Disposition: A | Discharge status: Home / Self Care | Payer: Worker's Compensation | Attending: Emergency Medicine | Admitting: Emergency Medicine

## 2016-11-11 ENCOUNTER — Encounter (HOSPITAL_BASED_OUTPATIENT_CLINIC_OR_DEPARTMENT_OTHER): Payer: Self-pay

## 2016-11-11 DIAGNOSIS — I10 Essential (primary) hypertension: Secondary | ICD-10-CM | POA: Insufficient documentation

## 2016-11-11 DIAGNOSIS — Y929 Unspecified place or not applicable: Secondary | ICD-10-CM | POA: Insufficient documentation

## 2016-11-11 DIAGNOSIS — M542 Cervicalgia: Secondary | ICD-10-CM | POA: Diagnosis not present

## 2016-11-11 DIAGNOSIS — R55 Syncope and collapse: Secondary | ICD-10-CM | POA: Insufficient documentation

## 2016-11-11 DIAGNOSIS — Y939 Activity, unspecified: Secondary | ICD-10-CM | POA: Diagnosis not present

## 2016-11-11 DIAGNOSIS — K0882 Secondary occlusal trauma: Secondary | ICD-10-CM | POA: Diagnosis not present

## 2016-11-11 DIAGNOSIS — S0990XA Unspecified injury of head, initial encounter: Secondary | ICD-10-CM

## 2016-11-11 DIAGNOSIS — W208XXA Other cause of strike by thrown, projected or falling object, initial encounter: Secondary | ICD-10-CM | POA: Diagnosis not present

## 2016-11-11 DIAGNOSIS — Y999 Unspecified external cause status: Secondary | ICD-10-CM | POA: Diagnosis not present

## 2016-11-11 DIAGNOSIS — K0889 Other specified disorders of teeth and supporting structures: Secondary | ICD-10-CM

## 2016-11-11 LAB — CBC WITH DIFFERENTIAL/PLATELET
BASOS ABS: 0 10*3/uL (ref 0.0–0.1)
Basophils Relative: 1 %
Eosinophils Absolute: 0.2 10*3/uL (ref 0.0–0.7)
Eosinophils Relative: 3 %
HEMATOCRIT: 42.5 % (ref 39.0–52.0)
HEMOGLOBIN: 14.9 g/dL (ref 13.0–17.0)
LYMPHS PCT: 26 %
Lymphs Abs: 1.6 10*3/uL (ref 0.7–4.0)
MCH: 30.2 pg (ref 26.0–34.0)
MCHC: 35.1 g/dL (ref 30.0–36.0)
MCV: 86 fL (ref 78.0–100.0)
Monocytes Absolute: 0.6 10*3/uL (ref 0.1–1.0)
Monocytes Relative: 9 %
NEUTROS ABS: 3.9 10*3/uL (ref 1.7–7.7)
Neutrophils Relative %: 61 %
Platelets: 252 10*3/uL (ref 150–400)
RBC: 4.94 MIL/uL (ref 4.22–5.81)
RDW: 14 % (ref 11.5–15.5)
WBC: 6.3 10*3/uL (ref 4.0–10.5)

## 2016-11-11 LAB — BASIC METABOLIC PANEL
ANION GAP: 10 (ref 5–15)
BUN: 22 mg/dL — ABNORMAL HIGH (ref 6–20)
CHLORIDE: 104 mmol/L (ref 101–111)
CO2: 22 mmol/L (ref 22–32)
Calcium: 8.6 mg/dL — ABNORMAL LOW (ref 8.9–10.3)
Creatinine, Ser: 0.99 mg/dL (ref 0.61–1.24)
GFR calc Af Amer: >60 mL/min (ref >60)
GLUCOSE: 105 mg/dL — AB (ref 65–99)
POTASSIUM: 3.8 mmol/L (ref 3.5–5.1)
Sodium: 136 mmol/L (ref 135–145)

## 2016-11-11 LAB — PROTIME-INR
INR: 0.9
Prothrombin Time: 12.1 seconds (ref 11.4–15.2)

## 2016-11-11 MED ORDER — DEXAMETHASONE SODIUM PHOSPHATE 10 MG/ML IJ SOLN
10.0000 mg | Freq: Once | INTRAMUSCULAR | Status: AC
  Administered 2016-11-11: 10 mg via INTRAVENOUS
  Filled 2016-11-11: qty 1

## 2016-11-11 MED ORDER — PROCHLORPERAZINE EDISYLATE 5 MG/ML IJ SOLN
10.0000 mg | Freq: Once | INTRAMUSCULAR | Status: AC
  Administered 2016-11-11: 10 mg via INTRAVENOUS
  Filled 2016-11-11: qty 2

## 2016-11-11 MED ORDER — DIPHENHYDRAMINE HCL 50 MG/ML IJ SOLN
25.0000 mg | Freq: Once | INTRAMUSCULAR | Status: AC
  Administered 2016-11-11: 25 mg via INTRAVENOUS
  Filled 2016-11-11: qty 1

## 2016-11-11 MED ORDER — DIAZEPAM 5 MG PO TABS: 5.0000 mg | ORAL_TABLET | Freq: Three times a day (TID) | ORAL | 0 refills | Status: DC | PRN

## 2016-11-11 MED ORDER — ONDANSETRON HCL 4 MG PO TABS: 4.0000 mg | ORAL_TABLET | Freq: Three times a day (TID) | ORAL | 0 refills | Status: DC | PRN

## 2016-11-11 NOTE — ED Notes (Signed)
Patient in CT

## 2016-11-11 NOTE — ED Triage Notes (Addendum)
Pt states a "dock door-weight 2000lb" fell onto top of head 3pm-no break in skin-po 3-4 sec LOC-pain to top of head,posterior neck, right jaw and bottom front tooth-NAD-steady gait-sent from local UC-hard ccollar applied

## 2016-11-11 NOTE — Discharge Instructions (Signed)
Your workup today did not show evidence of fractures. We suspect you have a serious concussion. Please take the nausea medicine to help with nausea so that he can stay hydrated. Please use the Valium to help with your headache and muscle spasm pain. Please schedule a follow-up appointment with your primary care physician for further pain management and for reassessment. Please call a dentist to follow-up for your loose tooth. If any symptoms change or worsen, please return to the nearest emergency department.

## 2016-11-11 NOTE — ED Provider Notes (Signed)
[facility] Provider Note   CSN: [id number] Arrival date & time: 11/11/16  1805  By signing my name below, I, Cynda Acres, attest that this documentation has been prepared under the direction and in the presence of Laylee Schooley, Canary Brim, MD. Electronically Signed: Cynda Acres, Scribe. 11/11/16. 7:29 PM.  History   Chief Complaint Chief Complaint  Patient presents with  . Head Injury    HPI Comments: Joel Matthews is a 61 y.o. male with a history of hypertension, who presents to the Emergency Department complaining of a sudden-onset headache s/p head injury that occurred earlier today at 3 pm. Patient states a 2,000 pound dock door fell on top of his head at 15 feet. Patient states he did lose consciousness. Patient reports associated nausea, vomiting, right jaw pain, posterior neck pain, blurred vision (bilateral eyes), and lower teeth pain. Patient was seen at urgent care, a c-collar was placed and he was advised to come here. No modifying factors indicated. Patient is allergic to codeine, breaks out in a rash. Patient rates the severity of his pain as an 11/10. Patient is ambulatory in the emergency department. Patient denies any fever, chills, abdominal pain, leg swelling, or any additional symptoms.   The history is provided by the patient. No language interpreter was used.  Head Injury   The incident occurred 3 to 5 hours ago. He came to the ER via walk-in. Injury mechanism: 2,000 pound dock door  He lost consciousness for a period of less than one minute. There was no blood loss. The quality of the pain is described as sharp. Pain scale: 11/10. The pain is severe. The pain has been constant since the injury. Associated symptoms include blurred vision and vomiting. Pertinent negatives include no numbness, no tinnitus, no disorientation, no weakness and no memory loss. He has tried nothing for the symptoms.    Past Medical History:  Diagnosis Date  . Hypertension      Patient Active Problem List   Diagnosis Date Noted  . Testicular pain, left 04/12/2016  . Obesity 07/07/2014  . Essential hypertension, benign 03/06/2013    Past Surgical History:  Procedure Laterality Date  . TONSILLECTOMY         Home Medications    Prior to Admission medications   Medication Sig Start Date End Date Taking? Authorizing Provider  lisinopril (PRINIVIL,ZESTRIL) 20 MG tablet Take 1 tablet (20 mg total) by mouth daily. 11/10/16   Sunnie Nielsen, DO    Family History Family History  Problem Relation Age of Onset  . Heart attack Father   . Diabetes Father     Social History Social History  Substance Use Topics  . Smoking status: Never Smoker  . Smokeless tobacco: Never Used  . Alcohol use No     Allergies   Codeine   Review of Systems Review of Systems  Constitutional: Negative for chills and fever.  HENT: Positive for dental problem. Negative for tinnitus.        Right jaw pain.  Eyes: Positive for blurred vision.  Cardiovascular: Negative for leg swelling.  Gastrointestinal: Positive for vomiting. Negative for abdominal pain, diarrhea and nausea.  Musculoskeletal: Positive for neck pain.  Neurological: Positive for headaches. Negative for weakness and numbness.  Psychiatric/Behavioral: Negative for memory loss.  All other systems reviewed and are negative.    Physical Exam Updated Vital Signs BP 138/87 (BP Location: Left Arm)   Pulse 68   Temp 98 F (36.7 C) (Oral)   Resp 16  Ht 5\' 3"  (1.6 m)   Wt 180 lb (81.6 kg)   SpO2 100%   BMI 31.89 kg/m   Physical Exam  Constitutional: He is oriented to person, place, and time. He appears well-developed and well-nourished. No distress.  HENT:  Head: Head is without laceration.    Right Ear: External ear normal.  Left Ear: External ear normal.  Nose: Nose normal.  Mouth/Throat: Uvula is midline and oropharynx is clear and moist. Abnormal dentition. No dental abscesses, uvula  swelling or lacerations. No oropharyngeal exudate.    Tenderness to the entire face, including the nose, orbits, and maxilla. Tenderness on the bilateral jaw. Patient has a loose lower mid tooth. No hemotympanum.   Eyes: Conjunctivae and EOM are normal. Pupils are equal, round, and reactive to light.  Neck: Normal range of motion. Neck supple. Muscular tenderness present. No spinous process tenderness present. No neck rigidity. Normal range of motion present.    Cardiovascular: Normal rate, regular rhythm and intact distal pulses.   No murmur heard. Pulmonary/Chest: Effort normal and breath sounds normal. No stridor. No respiratory distress. He has no wheezes. He exhibits no tenderness.  Abdominal: Soft. Bowel sounds are normal. There is no tenderness.  Musculoskeletal: Normal range of motion. He exhibits tenderness.  Unremarkable finger to nose. 5/5 strength in the upper and lower extremities. No back tenderness. Mild tenderness to lateral neck.   Neurological: He is alert and oriented to person, place, and time. He is not disoriented. He displays no tremor and normal reflexes. No cranial nerve deficit or sensory deficit. He exhibits normal muscle tone. He displays no seizure activity. Coordination and gait normal.  Skin: Skin is warm and dry. Capillary refill takes less than 2 seconds. He is not diaphoretic. No erythema. No pallor.  Psychiatric: He has a normal mood and affect.  Nursing note and vitals reviewed.    ED Treatments / Results  DIAGNOSTIC STUDIES: Oxygen Saturation is 100% on RA, normal by my interpretation.    COORDINATION OF CARE: 7:26 PM Discussed treatment plan with pt at bedside and pt agreed to plan, which includes imaging.   Labs (all labs ordered are listed, but only abnormal results are displayed) Labs Reviewed  BASIC METABOLIC PANEL - Abnormal; Notable for the following:       Result Value   Glucose, Bld 105 (*)    BUN 22 (*)    Calcium 8.6 (*)    All other  components within normal limits  CBC WITH DIFFERENTIAL/PLATELET  PROTIME-INR    EKG  EKG Interpretation None       Radiology Ct Head Wo Contrast  Result Date: 11/11/2016 CLINICAL DATA:  Door fell on top of head. Headache, facial and neck pain. EXAM: CT HEAD WITHOUT CONTRAST CT MAXILLOFACIAL WITHOUT CONTRAST CT CERVICAL SPINE WITHOUT CONTRAST TECHNIQUE: Multidetector CT imaging of the head, cervical spine, and maxillofacial structures were performed using the standard protocol without intravenous contrast. Multiplanar CT image reconstructions of the cervical spine and maxillofacial structures were also generated. COMPARISON:  None. FINDINGS: CT HEAD FINDINGS BRAIN: The ventricles and sulci are within normal for age. No intraparenchymal hemorrhage, mass effect nor midline shift. No acute large vascular territory infarcts. No abnormal extra-axial fluid collections. Basal cisterns are patent. Bold brainstem and cerebellum are unremarkable. VASCULAR: No hyperdense vessels or unexpected calcifications. SKULL/SOFT TISSUES: No skull fracture. No significant soft tissue swelling. OTHER: None. CT MAXILLOFACIAL FINDINGS OSSEOUS: The mandible is intact, the condyles are located. No acute facial fracture. No destructive  bony lesions. ORBITS: Ocular globes and orbital contents are intact. Small calcified drusen noted of both globes. SINUSES: Paranasal sinuses are well aerated. Nasal septum is midline. Included mastoid air cells are well aerated. SOFT TISSUES: No significant soft tissue swelling. No subcutaneous gas or radiopaque foreign bodies. CT CERVICAL SPINE FINDINGS ALIGNMENT: Cervical vertebral bodies in alignment. Slight straightening of cervical lordosis possibly from muscle spasm or positioning. SKULL BASE AND VERTEBRAE: Cervical vertebral bodies and posterior elements are intact. No destructive bony lesions. C1-2 articulation is maintained. Uncovertebral joint osteoarthritic spurring on the right at C2-3,  bilaterally at C3-4, C4-5 and to greater extent C5-6. SOFT TISSUES AND SPINAL CANAL: Included prevertebral and paraspinal soft tissues are normal. DISC LEVELS: No significant osseous canal stenosis or neural foraminal narrowing. Degenerative disc space narrowing C5 through T1 with small posterior marginal osteophytes. Mild neural foraminal encroachment C5-6 and C6-7 bilaterally. UPPER CHEST: Lung apices are clear. OTHER: None. IMPRESSION: 1. No acute intracranial abnormality or skull fracture. 2. Intact facial bones. 3. Cervical spondylosis with degenerative disc disease C5 through T1. No acute cervical spine fracture or posttraumatic subluxation. Electronically Signed   By: Tollie Eth M.D.   On: 11/11/2016 20:40   Ct Cervical Spine Wo Contrast  Result Date: 11/11/2016 CLINICAL DATA:  Door fell on top of head. Headache, facial and neck pain. EXAM: CT HEAD WITHOUT CONTRAST CT MAXILLOFACIAL WITHOUT CONTRAST CT CERVICAL SPINE WITHOUT CONTRAST TECHNIQUE: Multidetector CT imaging of the head, cervical spine, and maxillofacial structures were performed using the standard protocol without intravenous contrast. Multiplanar CT image reconstructions of the cervical spine and maxillofacial structures were also generated. COMPARISON:  None. FINDINGS: CT HEAD FINDINGS BRAIN: The ventricles and sulci are within normal for age. No intraparenchymal hemorrhage, mass effect nor midline shift. No acute large vascular territory infarcts. No abnormal extra-axial fluid collections. Basal cisterns are patent. Bold brainstem and cerebellum are unremarkable. VASCULAR: No hyperdense vessels or unexpected calcifications. SKULL/SOFT TISSUES: No skull fracture. No significant soft tissue swelling. OTHER: None. CT MAXILLOFACIAL FINDINGS OSSEOUS: The mandible is intact, the condyles are located. No acute facial fracture. No destructive bony lesions. ORBITS: Ocular globes and orbital contents are intact. Small calcified drusen noted of both  globes. SINUSES: Paranasal sinuses are well aerated. Nasal septum is midline. Included mastoid air cells are well aerated. SOFT TISSUES: No significant soft tissue swelling. No subcutaneous gas or radiopaque foreign bodies. CT CERVICAL SPINE FINDINGS ALIGNMENT: Cervical vertebral bodies in alignment. Slight straightening of cervical lordosis possibly from muscle spasm or positioning. SKULL BASE AND VERTEBRAE: Cervical vertebral bodies and posterior elements are intact. No destructive bony lesions. C1-2 articulation is maintained. Uncovertebral joint osteoarthritic spurring on the right at C2-3, bilaterally at C3-4, C4-5 and to greater extent C5-6. SOFT TISSUES AND SPINAL CANAL: Included prevertebral and paraspinal soft tissues are normal. DISC LEVELS: No significant osseous canal stenosis or neural foraminal narrowing. Degenerative disc space narrowing C5 through T1 with small posterior marginal osteophytes. Mild neural foraminal encroachment C5-6 and C6-7 bilaterally. UPPER CHEST: Lung apices are clear. OTHER: None. IMPRESSION: 1. No acute intracranial abnormality or skull fracture. 2. Intact facial bones. 3. Cervical spondylosis with degenerative disc disease C5 through T1. No acute cervical spine fracture or posttraumatic subluxation. Electronically Signed   By: Tollie Eth M.D.   On: 11/11/2016 20:40   Ct Maxillofacial Wo Contrast  Result Date: 11/11/2016 CLINICAL DATA:  Door fell on top of head. Headache, facial and neck pain. EXAM: CT HEAD WITHOUT CONTRAST CT MAXILLOFACIAL WITHOUT CONTRAST  CT CERVICAL SPINE WITHOUT CONTRAST TECHNIQUE: Multidetector CT imaging of the head, cervical spine, and maxillofacial structures were performed using the standard protocol without intravenous contrast. Multiplanar CT image reconstructions of the cervical spine and maxillofacial structures were also generated. COMPARISON:  None. FINDINGS: CT HEAD FINDINGS BRAIN: The ventricles and sulci are within normal for age. No  intraparenchymal hemorrhage, mass effect nor midline shift. No acute large vascular territory infarcts. No abnormal extra-axial fluid collections. Basal cisterns are patent. Bold brainstem and cerebellum are unremarkable. VASCULAR: No hyperdense vessels or unexpected calcifications. SKULL/SOFT TISSUES: No skull fracture. No significant soft tissue swelling. OTHER: None. CT MAXILLOFACIAL FINDINGS OSSEOUS: The mandible is intact, the condyles are located. No acute facial fracture. No destructive bony lesions. ORBITS: Ocular globes and orbital contents are intact. Small calcified drusen noted of both globes. SINUSES: Paranasal sinuses are well aerated. Nasal septum is midline. Included mastoid air cells are well aerated. SOFT TISSUES: No significant soft tissue swelling. No subcutaneous gas or radiopaque foreign bodies. CT CERVICAL SPINE FINDINGS ALIGNMENT: Cervical vertebral bodies in alignment. Slight straightening of cervical lordosis possibly from muscle spasm or positioning. SKULL BASE AND VERTEBRAE: Cervical vertebral bodies and posterior elements are intact. No destructive bony lesions. C1-2 articulation is maintained. Uncovertebral joint osteoarthritic spurring on the right at C2-3, bilaterally at C3-4, C4-5 and to greater extent C5-6. SOFT TISSUES AND SPINAL CANAL: Included prevertebral and paraspinal soft tissues are normal. DISC LEVELS: No significant osseous canal stenosis or neural foraminal narrowing. Degenerative disc space narrowing C5 through T1 with small posterior marginal osteophytes. Mild neural foraminal encroachment C5-6 and C6-7 bilaterally. UPPER CHEST: Lung apices are clear. OTHER: None. IMPRESSION: 1. No acute intracranial abnormality or skull fracture. 2. Intact facial bones. 3. Cervical spondylosis with degenerative disc disease C5 through T1. No acute cervical spine fracture or posttraumatic subluxation. Electronically Signed   By: Tollie Eth M.D.   On: 11/11/2016 20:40     Procedures Procedures (including critical care time)  Medications Ordered in ED Medications  diphenhydrAMINE (BENADRYL) injection 25 mg (25 mg Intravenous Given 11/11/16 2208)  prochlorperazine (COMPAZINE) injection 10 mg (10 mg Intravenous Given 11/11/16 2208)  dexamethasone (DECADRON) injection 10 mg (10 mg Intravenous Given 11/11/16 2206)     Initial Impression / Assessment and Plan / ED Course  I have reviewed the triage vital signs and the nursing notes.  Pertinent labs & imaging results that were available during my care of the patient were reviewed by me and considered in my medical decision making (see chart for details).     Joel Matthews is a 61 y.o. male with a history of hypertension, who presents to the Emergency Department with a head injury. Patient had a heavy loading bay door smash into his head causing him to lose consciousness and have subsequent headache, face pain, neck pain, nausea, vomiting, unsteadiness, and mild blurry vision.  History and exam are seen above. Patient had no focal neurologic deficits on exam. Vision blurriness had cleared by time of evaluation. Normal coordination, sensation, and strength. No evidence of lacerations. Tenderness present on face and loose tooth present on lower jaw. Lungs clear and chest nontender. No other evidence of trauma. Ears unremarkable.  Given concern for trauma, imaging of the head, face, neck was ordered.  CT imaging revealed no acute fractures or bleeds. Degenerative disease in the cervical spine was observed. Patient informed of this. Cervical collar was removed after reassuring imaging. Patient had no midline cervical tenderness. Patient had bilateral muscular tenderness of  the neck. Normal range of motion.  Patient given had a cocktail with significant improvement in headache. Patient continued to have no neurologic deficits.  Patient had normal gait. Patient given prescription for nausea medication and, due to his  allergies, he reports that Valium was the only pain medicine that helped. Patient given prescription for several doses.  Patient given return precautions for any new or recurrent symptoms. Patient instructed to follow up with PCP and dentist. Patient understood return precautions. Patient discharged in good condition with no evidence of serious head injury but likely has concussion.   Final Clinical Impressions(s) / ED Diagnoses   Final diagnoses:  Injury of head, initial encounter  Loose tooth due to trauma  Neck pain    New Prescriptions Discharge Medication List as of 11/11/2016 10:06 PM    START taking these medications   Details  diazepam (VALIUM) 5 MG tablet Take 1 tablet (5 mg total) by mouth every 8 (eight) hours as needed for anxiety., Starting Fri 11/11/2016, Print    ondansetron (ZOFRAN) 4 MG tablet Take 1 tablet (4 mg total) by mouth every 8 (eight) hours as needed., Starting Fri 11/11/2016, Print      I personally performed the services described in this documentation, which was scribed in my presence. The recorded information has been reviewed and is accurate.   Clinical Impression: 1. Injury of head, initial encounter   2. Loose tooth due to trauma   3. Neck pain     Disposition: Discharge  Condition: Good  I have discussed the results, Dx and Tx plan with the pt(& family if present). He/she/they expressed understanding and agree(s) with the plan. Discharge instructions discussed at great length. Strict return precautions discussed and pt &/or family have verbalized understanding of the instructions. No further questions at time of discharge.    Discharge Medication List as of 11/11/2016 10:06 PM    START taking these medications   Details  diazepam (VALIUM) 5 MG tablet Take 1 tablet (5 mg total) by mouth every 8 (eight) hours as needed for anxiety., Starting Fri 11/11/2016, Print    ondansetron (ZOFRAN) 4 MG tablet Take 1 tablet (4 mg total) by mouth every 8 (eight)  hours as needed., Starting Fri 11/11/2016, Print        Follow Up: Sunnie Nielsen, DO [address] [place] [address] [phone]  Schedule an appointment as soon as possible for a visit    [facility] [place] [facility] [address] [id number] mc [address] [place] [address] [phone]  If symptoms worsen     Colston Pyle, Canary Brim, MD 11/12/16 [phone]

## 2017-11-14 IMAGING — CT CT MAXILLOFACIAL W/O CM
4 of 10 series · 16 of 47 positions shown, 18 images · non-contrast
Comparison: None.

CLINICAL DATA: Door fell on top of head. Headache, facial and neck
pain.

EXAM:
CT HEAD WITHOUT CONTRAST
CT MAXILLOFACIAL WITHOUT CONTRAST
CT CERVICAL SPINE WITHOUT CONTRAST
TECHNIQUE: Multidetector CT imaging of the head, cervical spine, and
maxillofacial structures were performed using the standard protocol
without intravenous contrast. Multiplanar CT image reconstructions
of the cervical spine and maxillofacial structures were also
generated.

[Series 6: max soft · axial · 0.36mm/px · z∈[+934,+1040]mm · 6 of 86 slices shown]
[im 11/86  brain]
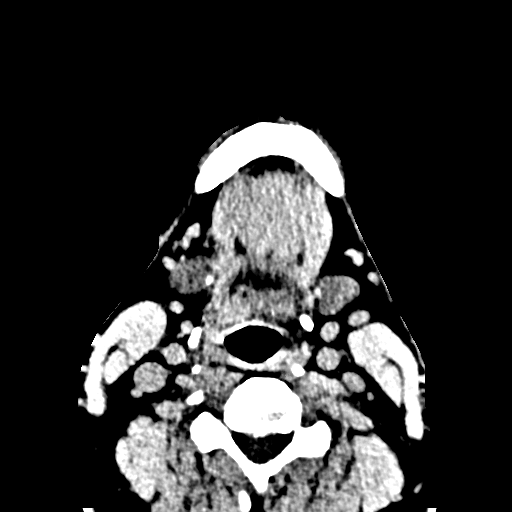
[im 22/86  brain]
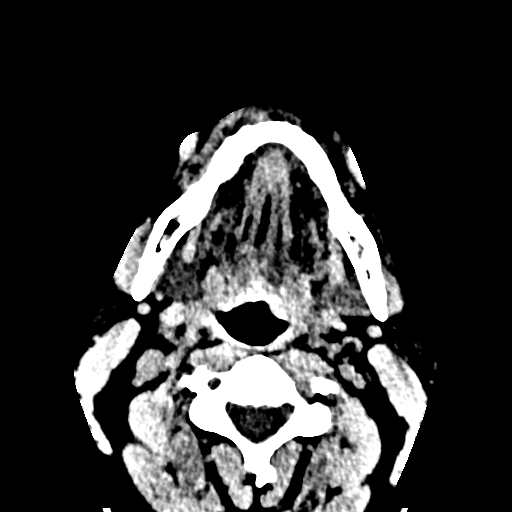
[im 32/86  brain]
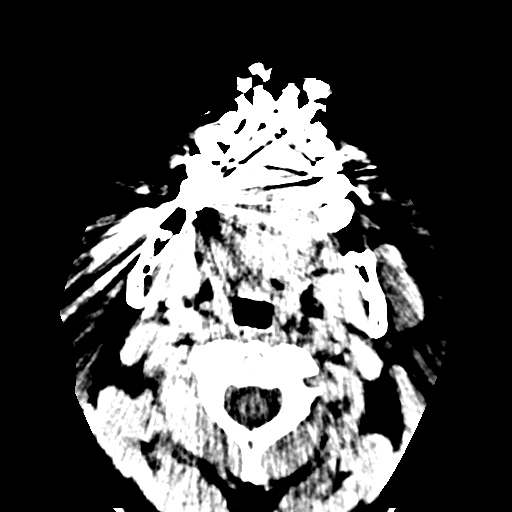
[im 43/86  brain]
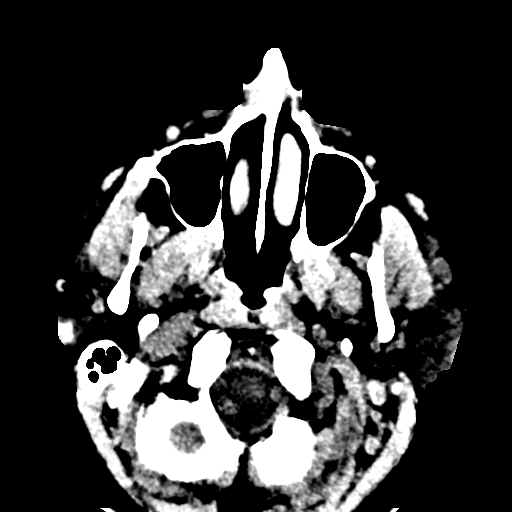
[im 54/86  brain]
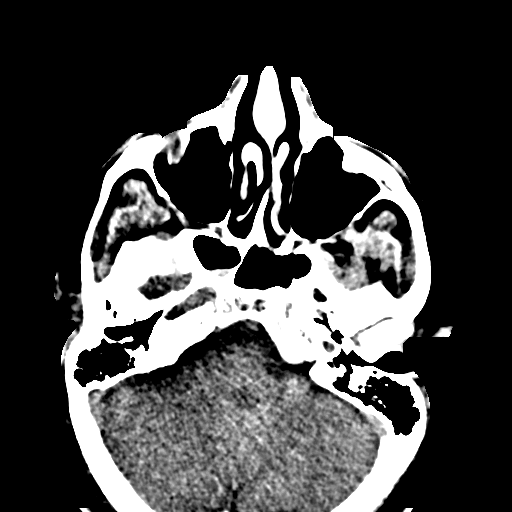
[im 64/86  brain]
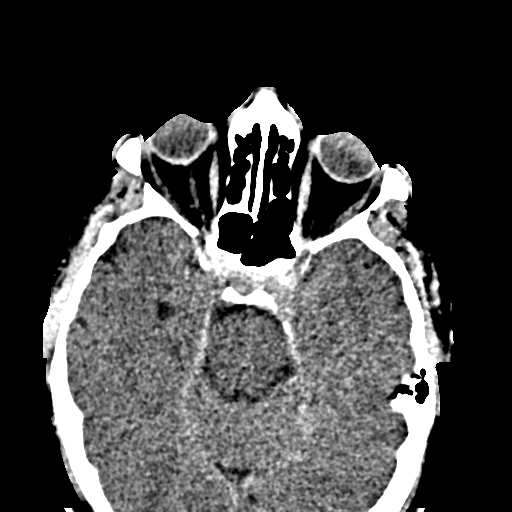

[Series 9: sagittal soft · sagittal · 0.36mm/px · 1 of 86 slices shown]
[im 43/86  bone]
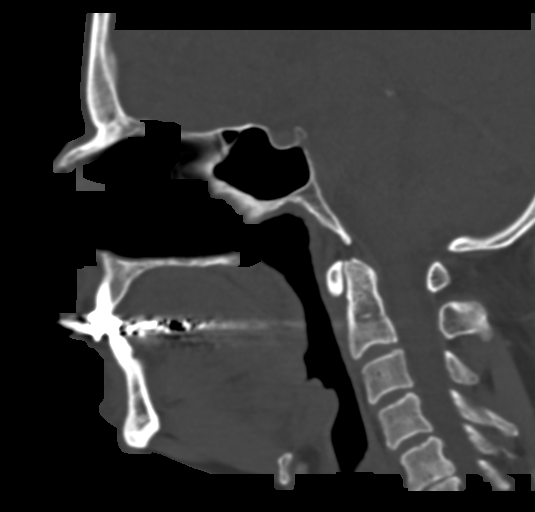

[Series 18: coronal bone · coronal · 0.32mm/px · 1 of 61 slices shown]
[im 31/61  bone]
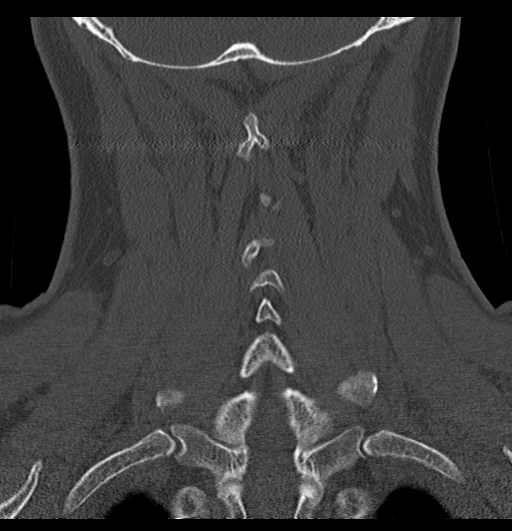

[Series 19: orthogonal axials · axial · 0.23mm/px · z∈[+851,+988]mm · 8 of 91 slices shown, 10 images]
[im 11/91  brain]
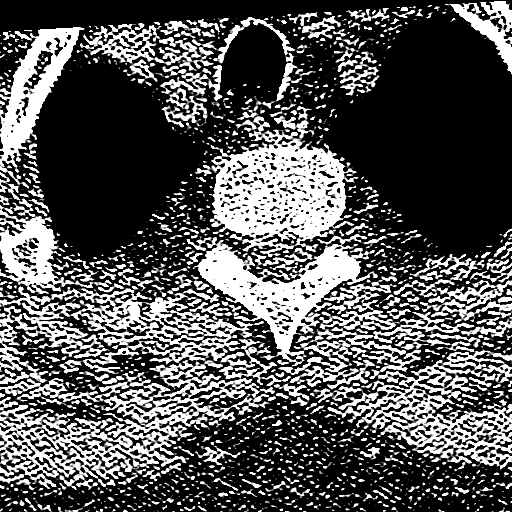
[im 11/91  bone]
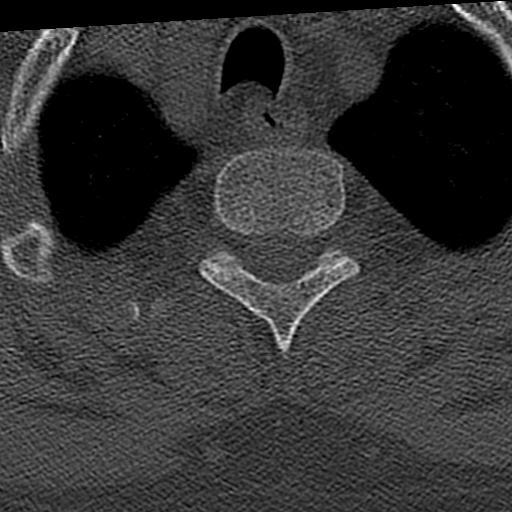
[im 21/91  bone]
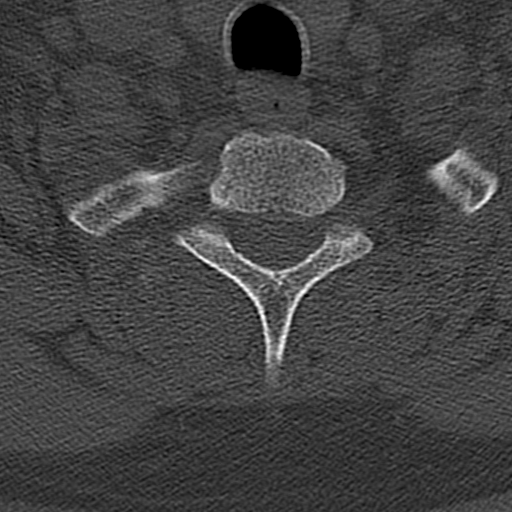
[im 31/91  bone]
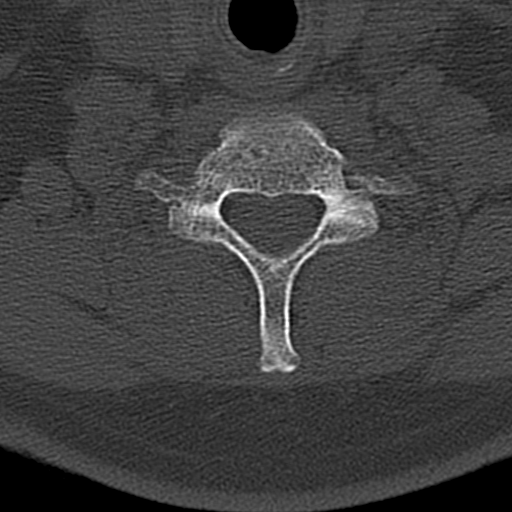
[im 41/91  bone]
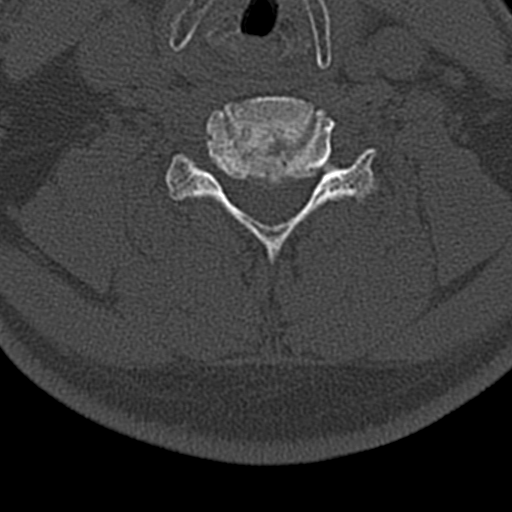
[im 51/91  brain]
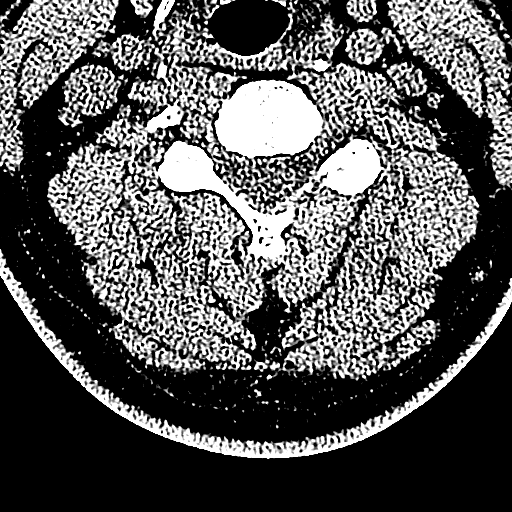
[im 51/91  bone]
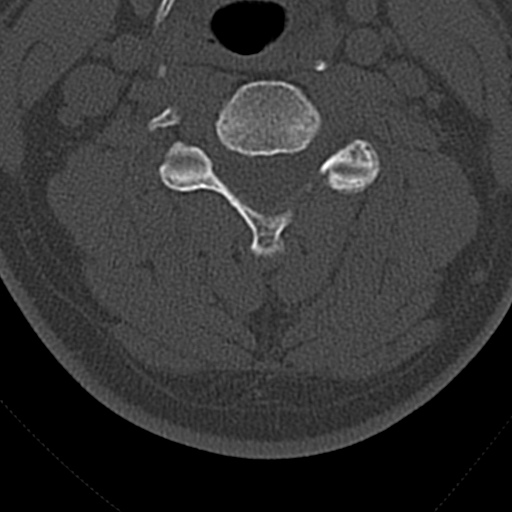
[im 61/91  bone]
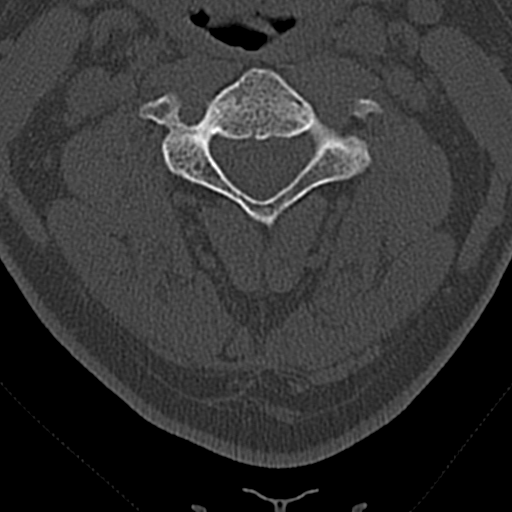
[im 71/91  bone]
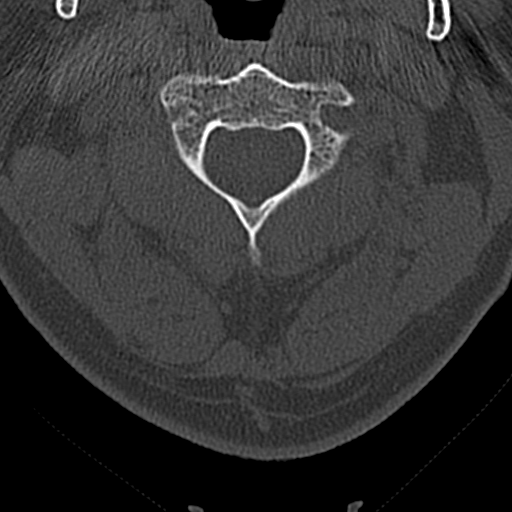
[im 81/91  bone]
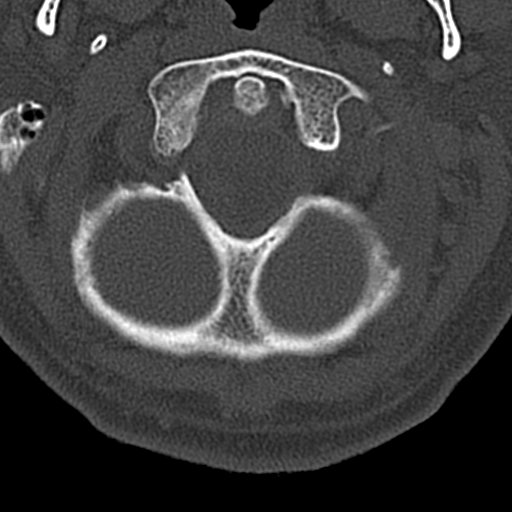

[16 of 47 positions shown; findings below may reference images not displayed]

FINDINGS: CT HEAD FINDINGS

BRAIN: The ventricles and sulci are within normal for age. No
intraparenchymal hemorrhage, mass effect nor midline shift. No acute
large vascular territory infarcts. No abnormal extra-axial fluid
collections. Basal cisterns are patent. Bold brainstem and
cerebellum are unremarkable.

VASCULAR: No hyperdense vessels or unexpected calcifications.

SKULL/SOFT TISSUES: No skull fracture. No significant soft tissue
swelling.

OTHER: None.

CT MAXILLOFACIAL FINDINGS

OSSEOUS: The mandible is intact, the condyles are located. No acute
facial fracture. No destructive bony lesions.

ORBITS: Ocular globes and orbital contents are intact. Small
calcified drusen noted of both globes.

SINUSES: Paranasal sinuses are well aerated. Nasal septum is
midline. Included mastoid air cells are well aerated.

SOFT TISSUES: No significant soft tissue swelling. No subcutaneous
gas or radiopaque foreign bodies.

CT CERVICAL SPINE FINDINGS

ALIGNMENT: Cervical vertebral bodies in alignment. Slight
straightening of cervical lordosis possibly from muscle spasm or
positioning.

SKULL BASE AND VERTEBRAE: Cervical vertebral bodies and posterior
elements are intact. No destructive bony lesions. C1-2 articulation
is maintained. Uncovertebral joint osteoarthritic spurring on the
right at C2-3, bilaterally at C3-4, C4-5 and to greater extent C5-6.

SOFT TISSUES AND SPINAL CANAL: Included prevertebral and paraspinal
soft tissues are normal.

DISC LEVELS: No significant osseous canal stenosis or neural
foraminal narrowing. Degenerative disc space narrowing C5 through T1
with small posterior marginal osteophytes. Mild neural foraminal
encroachment C5-6 and C6-7 bilaterally.

UPPER CHEST: Lung apices are clear.

OTHER: None.
IMPRESSION: 1. No acute intracranial abnormality or skull fracture.
2. Intact facial bones.
3. Cervical spondylosis with degenerative disc disease C5 through
T1. No acute cervical spine fracture or posttraumatic subluxation.

## 2017-11-16 ENCOUNTER — Other Ambulatory Visit: Payer: Self-pay | Admitting: Osteopathic Medicine

## 2017-11-16 DIAGNOSIS — I10 Essential (primary) hypertension: Secondary | ICD-10-CM

## 2017-11-27 ENCOUNTER — Ambulatory Visit (INDEPENDENT_AMBULATORY_CARE_PROVIDER_SITE_OTHER): Payer: Self-pay | Admitting: Osteopathic Medicine

## 2017-11-27 ENCOUNTER — Encounter: Payer: Self-pay | Admitting: Osteopathic Medicine

## 2017-11-27 ENCOUNTER — Ambulatory Visit (INDEPENDENT_AMBULATORY_CARE_PROVIDER_SITE_OTHER): Payer: Worker's Compensation

## 2017-11-27 VITALS — BP 126/79 | HR 85 | Temp 98.0°F | Wt 178.9 lb

## 2017-11-27 DIAGNOSIS — I10 Essential (primary) hypertension: Secondary | ICD-10-CM

## 2017-11-27 DIAGNOSIS — M25552 Pain in left hip: Secondary | ICD-10-CM

## 2017-11-27 MED ORDER — MELOXICAM 15 MG PO TABS: 15.0000 mg | ORAL_TABLET | Freq: Every day | ORAL | 0 refills | Status: DC

## 2017-11-27 MED ORDER — LISINOPRIL 20 MG PO TABS
20.0000 mg | ORAL_TABLET | Freq: Every day | ORAL | 1 refills | Status: DC
Start: 2017-11-27 — End: 2018-01-26

## 2017-11-27 NOTE — Progress Notes (Signed)
HPI: Joel Matthews is a 62 y.o. male who  has a past medical history of Hypertension.  he presents to [facility] today, 11/27/17,  for chief complaint of:  BP follow-up and med refills   HTN: has been out of meds for a few weeks, BP not too bad goal right now. He's been working on diet/exercise. No CP/SOB, no HA/VC. States home BP is typically S: 120-130s, D: 80s. Home BP cuff not verified in office.   HLD: overdue for lab recheck.   L hip pain: ongoing for about a year, hurts to walk on occasion, no injury recently, no numbness/tingling into the leg, some occasional lower back pain.    Past medical history, surgical history, and family history reviewed.  Current medication list and allergy/intolerance information reviewed.   (See remainder of HPI, ROS, Phys Exam below)  BP 126/79 (BP Location: Left Arm, Patient Position: Sitting, Cuff Size: Normal)   Pulse 85   Temp 98 F (36.7 C) (Oral)   Wt 178 lb 14.4 oz (81.1 kg)   BMI 31.69 kg/m   Dg Hip Unilat W Or W/o Pelvis 2-3 Views Left  Result Date: 11/27/2017 CLINICAL DATA:  62 year old male with left hip pain for months after door fell on head. Initial encounter. EXAM: DG HIP (WITH OR WITHOUT PELVIS) 2-3V LEFT COMPARISON:  None. FINDINGS: No fracture or dislocation. Very mild left hip joint degenerative changes. No plain film evidence of left femoral head avascular necrosis. IMPRESSION: Very mild left hip joint degenerative changes. Electronically Signed   By: Lacy Duverney M.D.   On: 11/27/2017 18:05     ASSESSMENT/PLAN:   Essential hypertension - Plan: CBC, COMPLETE METABOLIC PANEL WITH GFR, Lipid panel, lisinopril (PRINIVIL,ZESTRIL) 20 MG tablet  Left hip pain - Plan: DG HIP UNILAT W OR W/O PELVIS 2-3 VIEWS LEFT, meloxicam (MOBIC) 15 MG tablet   Meds ordered this encounter  Medications  . lisinopril (PRINIVIL,ZESTRIL) 20 MG tablet    Sig: Take 1 tablet (20 mg total) by mouth daily.     Dispense:  30 tablet    Refill:  1  . meloxicam (MOBIC) 15 MG tablet    Sig: Take 1 tablet (15 mg total) by mouth daily. Take daily for 1-2 weeks, then as needed for aches/pains    Dispense:  30 tablet    Refill:  0    Patient Instructions  Blood pressure:  Will try seeing what it looks like off the Lisinopril, keep it on hand if BP starts to go about 130/80  Hip Pain  Will try anti-inflammatories and get Xray, consider follow-up with sports medicine if no better      Review preventive care:   General  Most recent routine screening lipids/other labs: ordered today!   Tobacco: don't start!   Alcohol: limit 1-2 drinks per day maximum for most men  Recreational/Illicit Drugs: don't start!   Exercise: as tolerated  Mental health: if need for mental health care (medicines, counseling, other), or concerns about moods, please let me know!   Vaccines  Flu vaccine: recommended every fall (by Halloween!)  Shingles vaccine: recommended after age 77  Pneumonia vaccines: recommended after age 87 unless other reason such as diabetes, COPD/asthma, others prompts [place] to get it sooner  Tetanus booster: recommended every 10 years  Cancer screenings   Colon cancer screening: recommended at age 16, sooner if risk factors   Prostate cancer screening: recommendations vary, optional PSA blood test  Infection screenings .  HIV: recommended screening at least once age 63-65, more often if risk factors  . Gonorrhea/Chlamydia: many insurances require testing for anyone on birth control pills, otherwise screening as needed . Hepatitis C: recommended for anyone born [id number]  Other . Advanced Directive: Living Will and/or Healthcare Power of Attorney recommended for everyone, regardless of age or health . Cholesterol: recommended yearly . Diabetes: recommended yearly . Thyroid and Vitamin D: routine screening not recommended, most insurance will not cover this test           Follow-up plan: Return for recheck BP 6 weeks at annual preventive care physical, sooner if needed .     ############################################ ############################################ ############################################ ############################################    Outpatient Encounter Medications as of 11/27/2017  Medication Sig  . diazepam (VALIUM) 5 MG tablet Take 1 tablet (5 mg total) by mouth every 8 (eight) hours as needed for anxiety.  Marland Kitchen. lisinopril (PRINIVIL,ZESTRIL) 20 MG tablet Take 1 tablet (20 mg total) by mouth daily. Pt needs f/u appt w/PCP for refills  . ondansetron (ZOFRAN) 4 MG tablet Take 1 tablet (4 mg total) by mouth every 8 (eight) hours as needed.   No facility-administered encounter medications on file as of 11/27/2017.    Allergies  Allergen Reactions  . Codeine       Review of Systems:  Constitutional: No recent illness  HEENT: No  headache, no vision change  Cardiac: No  chest pain, No  pressure, No palpitations  Respiratory:  No  shortness of breath. No  Cough  Gastrointestinal: No  abdominal pain, no change on bowel habits  Musculoskeletal: No new myalgia/arthralgia  Skin: No  Rash  Hem/Onc: No  easy bruising/bleeding, No  abnormal lumps/bumps  Neurologic: No  weakness, No  Dizziness  Psychiatric: No  concerns with depression, No  concerns with anxiety  Exam:  BP 126/79 (BP Location: Left Arm, Patient Position: Sitting, Cuff Size: Normal)   Pulse 85   Temp 98 F (36.7 C) (Oral)   Wt 178 lb 14.4 oz (81.1 kg)   BMI 31.69 kg/m   Constitutional: VS see above. General Appearance: alert, well-developed, well-nourished, NAD  Eyes: Normal lids and conjunctive, non-icteric sclera  Ears, Nose, Mouth, Throat: MMM, Normal external inspection ears/nares/mouth/lips/gums.  Neck: No masses, trachea midline.   Respiratory: Normal respiratory effort. no wheeze, no rhonchi, no rales  Cardiovascular: S1/S2  normal, no murmur, no rub/gallop auscultated. RRR.   Musculoskeletal: Gait normal. Symmetric and independent movement of all extremities. Neg trochanteric bursa tenderness on L, (+)pain w/ FABER, (+)pain w/ log roll  Neurological: Normal balance/coordination. No tremor.  Skin: warm, dry, intact.   Psychiatric: Normal judgment/insight. Normal mood and affect. Oriented x3.   Visit summary with medication list and pertinent instructions was printed for patient to review, advised to alert us if any changes needed. All questions at time of visit were answered - patient instructed to contact office with any additional concerns. ER/RTC precautions were reviewed with the patient and understanding verbalized.   Follow-up plan: Return for recheck BP 6 weeks at annual preventive care physical, sooner if needed .  Note: Total time spent 25 minutes, greater than 50% of the visit was spent face-to-face counseling and coordinating care for the following: The primary encounter diagnosis was Essential hypertension. A diagnosis of Left hip pain was also pertinent to this visit.Marland Kitchen.  Please note: voice recognition software was used to produce this document, and typos may escape review. Please contact Dr. Lyn HollingsheadAlexander for any needed clarifications.

## 2017-11-27 NOTE — Patient Instructions (Addendum)
Blood pressure:  Will try seeing what it looks like off the Lisinopril, keep it on hand if BP starts to go about 130/80  Hip Pain  Will try anti-inflammatories and get Xray, consider follow-up with sports medicine if no better      Review preventive care:   General  Most recent routine screening lipids/other labs: ordered today!   Tobacco: don't start!   Alcohol: limit 1-2 drinks per day maximum for most men  Recreational/Illicit Drugs: don't start!   Exercise: as tolerated  Mental health: if need for mental health care (medicines, counseling, other), or concerns about moods, please let me know!   Vaccines  Flu vaccine: recommended every fall (by Halloween!)  Shingles vaccine: recommended after age 63  Pneumonia vaccines: recommended after age 63 unless other reason such as diabetes, COPD/asthma, others prompts us to get it sooner  Tetanus booster: recommended every 10 years  Cancer screenings   Colon cancer screening: recommended at age 63, sooner if risk factors   Prostate cancer screening: recommendations vary, optional PSA blood test  Infection screenings . HIV: recommended screening at least once age 418-65, more often if risk factors  . Gonorrhea/Chlamydia: many insurances require testing for anyone on birth control pills, otherwise screening as needed . Hepatitis C: recommended for anyone born [id number]  Other . Advanced Directive: Living Will and/or Healthcare Power of Attorney recommended for everyone, regardless of age or health . Cholesterol: recommended yearly . Diabetes: recommended yearly . Thyroid and Vitamin D: routine screening not recommended, most insurance will not cover this test

## 2017-11-28 ENCOUNTER — Telehealth: Payer: Self-pay

## 2017-11-28 MED ORDER — ONDANSETRON HCL 4 MG PO TABS: 4.0000 mg | ORAL_TABLET | Freq: Three times a day (TID) | ORAL | 1 refills | Status: DC | PRN

## 2017-11-28 NOTE — Telephone Encounter (Signed)
Pt called requesting med RF for ondansetron. [facility]

## 2017-11-28 NOTE — Telephone Encounter (Signed)
Left VM with update.  

## 2017-11-28 NOTE — Telephone Encounter (Signed)
Pt has been updated.  

## 2017-11-28 NOTE — Telephone Encounter (Signed)
sent 

## 2018-01-05 ENCOUNTER — Encounter: Payer: Self-pay | Admitting: Osteopathic Medicine

## 2018-01-26 ENCOUNTER — Encounter: Payer: Self-pay | Admitting: Osteopathic Medicine

## 2018-01-26 ENCOUNTER — Ambulatory Visit (INDEPENDENT_AMBULATORY_CARE_PROVIDER_SITE_OTHER): Payer: Managed Care, Other (non HMO) | Admitting: Osteopathic Medicine

## 2018-01-26 VITALS — BP 131/87 | HR 61 | Temp 98.0°F | Wt 178.3 lb

## 2018-01-26 DIAGNOSIS — Z0000 Encounter for general adult medical examination without abnormal findings: Secondary | ICD-10-CM | POA: Diagnosis not present

## 2018-01-26 DIAGNOSIS — I10 Essential (primary) hypertension: Secondary | ICD-10-CM | POA: Diagnosis not present

## 2018-01-26 DIAGNOSIS — R7309 Other abnormal glucose: Secondary | ICD-10-CM

## 2018-01-26 DIAGNOSIS — M25552 Pain in left hip: Secondary | ICD-10-CM | POA: Diagnosis not present

## 2018-01-26 MED ORDER — LISINOPRIL 20 MG PO TABS: 20.0000 mg | ORAL_TABLET | Freq: Every day | ORAL | 3 refills | Status: DC

## 2018-01-26 MED ORDER — MELOXICAM 15 MG PO TABS: 15.0000 mg | ORAL_TABLET | Freq: Every day | ORAL | 1 refills | Status: DC

## 2018-01-26 NOTE — Patient Instructions (Addendum)
General Preventive Care  Most recent routine screening lipids/other labs: ordered, please get this blood work done fasting!   Tobacco: don't! Alcohol: moderation is ok for most people. Recreational/Illicit Drugs: don't!  Exercise: as tolerated to reduce risk of cardiovascular disease and diabetes  Mental health: if need for mental health care (medicines, counseling, other), or concerns about moods, please let me know!   Sexual health: if need for STD testing, or concerns about pain/masses, libido/erectile concerns, please let me know!   Vaccines  Flu vaccine: recommended every fall (by Halloween!)  Shingles vaccine: Shingrix recommended after age 63 - will call you once we ave this in stock, or you can ask your pharmacist  Pneumonia vaccines: Prevnar and Pneumovax recommended after age 63  Tetanus booster: Tdap recommended every 10 years - you're up to date  Cancer screenings   Colon cancer screening: recommended at age 63, we will get you set up for the Cologuard test! See printed info.   Prostate cancer screening: recommendations vary, optional PSA blood test for men around age 63  Infection screenings . HIV: recommended screening at least once age 818-65, more often if risk factors  . Gonorrhea/Chlamydia: screening as needed  Hepatitis C: recommended for anyone born [id number]  TB: screening as needed based on risk or job requirements    Other . Bone Density Test: recommended for men at age 870 . Advanced Directive: Living Will and/or Healthcare Power of Attorney recommended for everyone over age 63, regardless of health status . Cholesterol & Diabetes: recommended screening annually . Thyroid and Vitamin D: routine screening not recommended, most insurance will not cover this test

## 2018-01-26 NOTE — Progress Notes (Signed)
HPI: Joel Matthews is a 62 y.o. male who  has a past medical history of Hypertension.  he presents to [facility] today, 01/26/18,  for chief complaint of: Annual  Patient here for annual physical / wellness exam.  See preventive care reviewed as below.  Recent labs ordered but not drawn yet   Additional concerns today include:  None     Past medical, surgical, social and family history reviewed:  Patient Active Problem List   Diagnosis Date Noted  . Testicular pain, left 04/12/2016  . Obesity 07/07/2014  . Essential hypertension, benign 03/06/2013    Past Surgical History:  Procedure Laterality Date  . TONSILLECTOMY      Social History   Tobacco Use  . Smoking status: Never Smoker  . Smokeless tobacco: Never Used  Substance Use Topics  . Alcohol use: No    Family History  Problem Relation Age of Onset  . Heart attack Father   . Diabetes Father      Current medication list and allergy/intolerance information reviewed:    Current Outpatient Medications  Medication Sig Dispense Refill  . lisinopril (PRINIVIL,ZESTRIL) 20 MG tablet Take 1 tablet (20 mg total) by mouth daily. 30 tablet 1  . meloxicam (MOBIC) 15 MG tablet Take 1 tablet (15 mg total) by mouth daily. Take daily for 1-2 weeks, then as needed for aches/pains 30 tablet 0  . ondansetron (ZOFRAN) 4 MG tablet Take 1 tablet (4 mg total) by mouth every 8 (eight) hours as needed. 30 tablet 1   No current facility-administered medications for this visit.     Allergies  Allergen Reactions  . Codeine Rash      Review of Systems:  Constitutional:  No  fever, no chills, No recent illness, No unintentional weight changes. No significant fatigue.   HEENT: No  headache, no vision change, no hearing change, No sore throat, No  sinus pressure  Cardiac: No  chest pain, No  pressure, No palpitations, No  Orthopnea  Respiratory:  No  shortness of breath. No   Cough  Gastrointestinal: No  abdominal pain, No  nausea, No  vomiting,  No  blood in stool, No  diarrhea, No  constipation   Musculoskeletal: No new myalgia/arthralgia  Skin: No  Rash, No other wounds/concerning lesions  Genitourinary: No  incontinence, No  abnormal genital bleeding, No abnormal genital discharge  Hem/Onc: No  easy bruising/bleeding, No  abnormal lymph node  Endocrine: No cold intolerance,  No heat intolerance. No polyuria/polydipsia/polyphagia   Neurologic: No  weakness, No  dizziness, No  slurred speech/focal weakness/facial droop  Psychiatric: No  concerns with depression, No  concerns with anxiety, No sleep problems, No mood problems  Exam:  BP 131/87 (BP Location: Left Arm, Patient Position: Sitting, Cuff Size: Normal)   Pulse 61   Temp 98 F (36.7 C) (Oral)   Wt 178 lb 4.8 oz (80.9 kg)   BMI 31.58 kg/m   Constitutional: VS see above. General Appearance: alert, well-developed, well-nourished, NAD  Eyes: Normal lids and conjunctive, non-icteric sclera  Ears, Nose, Mouth, Throat: MMM, Normal external inspection ears/nares/mouth/lips/gums. TM normal bilaterally. Pharynx/tonsils no erythema, no exudate. Nasal mucosa normal.   Neck: No masses, trachea midline. No thyroid enlargement. No tenderness/mass appreciated. No lymphadenopathy  Respiratory: Normal respiratory effort. no wheeze, no rhonchi, no rales  Cardiovascular: S1/S2 normal, no murmur, no rub/gallop auscultated. RRR. No lower extremity edema. Pedal pulse II/IV bilaterally DP and PT. No carotid bruit or JVD.  No abdominal aortic bruit.  Gastrointestinal: Nontender, no masses. No hepatomegaly, no splenomegaly. No hernia appreciated. Bowel sounds normal. Rectal exam deferred.   Musculoskeletal: Gait normal. No clubbing/cyanosis of digits.   Neurological: Normal balance/coordination. No tremor. No cranial nerve deficit on limited exam. Motor and sensation intact and symmetric. Cerebellar reflexes  intact.   Skin: warm, dry, intact. No rash/ulcer. No concerning nevi or subq nodules on limited exam.    Psychiatric: Normal judgment/insight. Normal mood and affect. Oriented x3.      ASSESSMENT/PLAN:   Annual physical exam - Plan: PSA, Total with Reflex to PSA, Free  Essential hypertension - Plan: lisinopril (PRINIVIL,ZESTRIL) 20 MG tablet  Elevated glucose  Left hip pain - Plan: meloxicam (MOBIC) 15 MG tablet   I think he'll make it a little longer. But just in case, we also discussed the importance of advanced directives. Good sense of humor!      Patient Instructions  General Preventive Care  Most recent routine screening lipids/other labs: ordered, please get this blood work done fasting!   Tobacco: don't! Alcohol: moderation is ok for most people. Recreational/Illicit Drugs: don't!  Exercise: as tolerated to reduce risk of cardiovascular disease and diabetes  Mental health: if need for mental health care (medicines, counseling, other), or concerns about moods, please let me know!   Sexual health: if need for STD testing, or concerns about pain/masses, libido/erectile concerns, please let me know!   Vaccines  Flu vaccine: recommended every fall (by Halloween!)  Shingles vaccine: Shingrix recommended after age 63 - will call you once we ave this in stock, or you can ask your pharmacist  Pneumonia vaccines: Prevnar and Pneumovax recommended after age 63  Tetanus booster: Tdap recommended every 10 years - you're up to date  Cancer screenings   Colon cancer screening: recommended at age 63, we will get you set up for the Cologuard test! See printed info.   Prostate cancer screening: recommendations vary, optional PSA blood test for men around age 63  Infection screenings . HIV: recommended screening at least once age 63-65, more often if risk factors  . Gonorrhea/Chlamydia: screening as needed  Hepatitis C: recommended for anyone born [id number]  TB:  screening as needed based on risk or job requirements    Other . Bone Density Test: recommended for men at age 63 . Advanced Directive: Living Will and/or Healthcare Power of Attorney recommended for everyone over age 63, regardless of health status . Cholesterol & Diabetes: recommended screening annually . Thyroid and Vitamin D: routine screening not recommended, most insurance will not cover this test     Visit summary with medication list and pertinent instructions was printed for patient to review. All questions at time of visit were answered - patient instructed to contact office with any additional concerns. ER/RTC precautions were reviewed with the patient.   Follow-up plan: Return in about 6 months (around 07/29/2018) for monitr blood pressure .    Please note: voice recognition software was used to produce this document, and typos may escape review. Please contact Dr. Lyn HollingsheadAlexander for any needed clarifications.

## 2018-01-30 ENCOUNTER — Telehealth: Payer: Self-pay | Admitting: Osteopathic Medicine

## 2018-01-30 NOTE — Telephone Encounter (Signed)
-----   Message from Sunnie NielsenNatalie Alexander, DO sent at 01/26/2018  8:37 AM EDT ----- Regarding: shingrix list  shingrix xoxo

## 2018-01-30 NOTE — Telephone Encounter (Signed)
Added

## 2018-03-09 ENCOUNTER — Ambulatory Visit (INDEPENDENT_AMBULATORY_CARE_PROVIDER_SITE_OTHER): Payer: Managed Care, Other (non HMO)

## 2018-03-09 ENCOUNTER — Ambulatory Visit (INDEPENDENT_AMBULATORY_CARE_PROVIDER_SITE_OTHER): Payer: Managed Care, Other (non HMO) | Admitting: Sports Medicine

## 2018-03-09 VITALS — BP 134/82 | HR 67 | Temp 97.7°F

## 2018-03-09 DIAGNOSIS — S2242XA Multiple fractures of ribs, left side, initial encounter for closed fracture: Secondary | ICD-10-CM

## 2018-03-09 DIAGNOSIS — Z23 Encounter for immunization: Secondary | ICD-10-CM

## 2018-03-09 DIAGNOSIS — R0789 Other chest pain: Secondary | ICD-10-CM | POA: Insufficient documentation

## 2018-03-09 DIAGNOSIS — R0781 Pleurodynia: Secondary | ICD-10-CM

## 2018-03-09 MED ORDER — TRAMADOL HCL 50 MG PO TABS
50.0000 mg | ORAL_TABLET | Freq: Three times a day (TID) | ORAL | 0 refills | Status: DC | PRN
Start: 2018-03-09 — End: 2021-01-07
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
## 2018-03-09 NOTE — Progress Notes (Signed)
Pt returned to office after x-ray reporting that he felt light headed.  Pt states he has not eaten anything today and he has only drank a cup of coffee.   Vitals normal at 134/82 pulse 67.  Pt ate some graham crackers with peanut butter and drank some ginger ale and reported feeling much better. Advised pt to continue to drink plenty of fluids today and make sure not to skip meals. Pt to call office if SX of lightheadedness return.  No further needs at this time.
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
## 2018-03-12 LAB — PSA, TOTAL WITH REFLEX TO PSA, FREE: PSA, Total: 8.7 ng/mL — ABNORMAL HIGH (ref <=4.0)

## 2018-03-12 LAB — REFLEX PSA, FREE
PSA, % Free: 16 % (calc) — ABNORMAL LOW (ref >25)
PSA, Free: 1.4 ng/mL

## 2018-03-13 ENCOUNTER — Other Ambulatory Visit: Payer: Self-pay | Admitting: Osteopathic Medicine

## 2018-03-13 DIAGNOSIS — R972 Elevated prostate specific antigen [PSA]: Secondary | ICD-10-CM

## 2018-05-07 ENCOUNTER — Ambulatory Visit: Payer: Self-pay

## 2018-05-14 ENCOUNTER — Ambulatory Visit (INDEPENDENT_AMBULATORY_CARE_PROVIDER_SITE_OTHER): Payer: Self-pay | Admitting: Osteopathic Medicine

## 2018-05-14 VITALS — Temp 97.7°F

## 2018-05-14 DIAGNOSIS — Z23 Encounter for immunization: Secondary | ICD-10-CM

## 2018-05-14 NOTE — Progress Notes (Signed)
Pt came into clinic today for second shingrix vaccine. Reports he tolerated the first immunization well. Pt tolerated injection in left deltoid well, no immediate complications.

## 2018-06-12 ENCOUNTER — Other Ambulatory Visit: Payer: Self-pay | Admitting: Orthopaedic Surgery

## 2018-06-12 DIAGNOSIS — M545 Low back pain, unspecified: Secondary | ICD-10-CM

## 2018-06-12 DIAGNOSIS — M4989 Spondylopathy in diseases classified elsewhere, multiple sites in spine: Secondary | ICD-10-CM

## 2018-06-18 ENCOUNTER — Other Ambulatory Visit: Payer: Worker's Compensation
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
## 2019-02-13 ENCOUNTER — Other Ambulatory Visit: Payer: Self-pay | Admitting: Osteopathic Medicine

## 2019-02-13 DIAGNOSIS — I10 Essential (primary) hypertension: Secondary | ICD-10-CM

## 2019-02-13 NOTE — Telephone Encounter (Signed)
Requested medication (s) are due for refill today: yes  Requested medication (s) are on the active medication list: yes  Last refill:  10/29/2018  Future visit scheduled: no  Notes to clinic:  Review for refill   Requested Prescriptions  Pending Prescriptions Disp Refills   lisinopril (ZESTRIL) 20 MG tablet [Pharmacy Med Name: Lisinopril 20 MG Oral Tablet] 90 tablet 0    Sig: Take 1 tablet by mouth once daily     Cardiovascular:  ACE Inhibitors Failed - 02/13/2019  1:15 PM      Failed - Cr in normal range and within 180 days    Creat  Date Value Ref Range Status  04/12/2016 1.04 0.70 - 1.25 mg/dL Final    Comment:      For patients > or = 64 years of age: The upper reference limit for Creatinine is approximately 13% higher for people identified as African-American.      Creatinine, Ser  Date Value Ref Range Status  11/11/2016 0.99 0.61 - 1.24 mg/dL Final         Failed - K in normal range and within 180 days    Potassium  Date Value Ref Range Status  11/11/2016 3.8 3.5 - 5.1 mmol/L Final         Failed - Valid encounter within last 6 months    Recent Outpatient Visits          9 months ago Need for shingles vaccine   [facility], [place], DO   11 months ago Need for shingles vaccine   [facility], Gwen Her, MD   1 year ago Annual physical exam   [facility] [place] [facility], Lanelle Bal, DO   1 year ago Essential hypertension   [facility] [place], Lanelle Bal, DO   2 years ago Essential hypertension   [facility] [facility], Lanelle Bal, DO             Passed - Patient is not pregnant      Passed - Last BP in normal range    BP Readings from Last 1 Encounters:  03/09/18 134/82

## 2019-03-14 ENCOUNTER — Telehealth: Payer: Self-pay | Admitting: Osteopathic Medicine

## 2019-03-14 DIAGNOSIS — I10 Essential (primary) hypertension: Secondary | ICD-10-CM

## 2019-03-14 MED ORDER — LISINOPRIL 20 MG PO TABS: 20.0000 mg | ORAL_TABLET | Freq: Every day | ORAL | 0 refills | Status: DC

## 2019-03-14 NOTE — Telephone Encounter (Signed)
Patient would like an emergency refill of the lisinopril (ZESTRIL) 20 MG tablet [[id number]] until his appointment on Monday. Please advise.

## 2019-03-14 NOTE — Telephone Encounter (Signed)
Short term Rx sent.

## 2019-03-18 ENCOUNTER — Encounter: Payer: Self-pay | Admitting: Osteopathic Medicine

## 2019-03-18 ENCOUNTER — Ambulatory Visit (INDEPENDENT_AMBULATORY_CARE_PROVIDER_SITE_OTHER): Payer: Self-pay | Admitting: Osteopathic Medicine

## 2019-03-18 VITALS — BP 126/88 | HR 87 | Temp 97.0°F | Wt 170.0 lb

## 2019-03-18 DIAGNOSIS — I10 Essential (primary) hypertension: Secondary | ICD-10-CM

## 2019-03-18 DIAGNOSIS — J019 Acute sinusitis, unspecified: Secondary | ICD-10-CM

## 2019-03-18 MED ORDER — LISINOPRIL 20 MG PO TABS: 20.0000 mg | ORAL_TABLET | Freq: Every day | ORAL | 0 refills | Status: DC

## 2019-03-18 MED ORDER — AMOXICILLIN-POT CLAVULANATE 875-125 MG PO TABS: 1.0000 | ORAL_TABLET | Freq: Two times a day (BID) | ORAL | 0 refills | Status: DC

## 2019-03-18 NOTE — Progress Notes (Signed)
Virtual Visit via Phone  I connected with      [name] on 03/18/19 at 4:00 by a telemedicine application and verified that I am speaking with the correct person using two identifiers.  Patient is in car I am in office   I discussed the limitations of evaluation and management by telemedicine and the availability of in person appointments. The patient expressed understanding and agreed to proceed.  History of Present Illness: Joel Matthews is a 63 y.o. male who would like to discuss cough    . Location: sinuses and chest  . Quality: cough, sinus congestion  . Duration: 3 weeks total  . Timing: worse at night  . Modifying factors: Zyrtec-D not really helping and is keeping him up at night.  . Assoc signs/symptoms: YES postnasal drip, sinus congestion. NO fever, chills, body aches      Observations/Objective: BP 126/88 (Patient Position: Sitting, Cuff Size: Normal)   Pulse 87   Temp (!) 97 F (36.1 C) (Oral)   Wt 170 lb (77.1 kg)   BMI 30.11 kg/m  BP Readings from Last 3 Encounters:  03/18/19 126/88  03/09/18 134/82  01/26/18 131/87   Exam: Normal Speech.  NAD  Lab and Radiology Results No results found for this or any previous visit (from the past 72 hour(s)). No results found.     Assessment and Plan: 63 y.o. male with The primary encounter diagnosis was Acute non-recurrent sinusitis, unspecified location. A diagnosis of Essential hypertension was also pertinent to this visit.   PDMP not reviewed this encounter. No orders of the defined types were placed in this encounter.  Meds ordered this encounter  Medications  . lisinopril (ZESTRIL) 20 MG tablet    Sig: Take 1 tablet (20 mg total) by mouth daily.    Dispense:  90 tablet    Refill:  0  . amoxicillin-clavulanate (AUGMENTIN) 875-125 MG tablet    Sig: Take 1 tablet by mouth 2 (two) times daily.    Dispense:  14 tablet    Refill:  0      Follow Up Instructions: Return if symptoms worsen or  fail to improve, and annual in next 3 months.    I discussed the assessment and treatment plan with the patient. The patient was provided an opportunity to ask questions and all were answered. The patient agreed with the plan and demonstrated an understanding of the instructions.   The patient was advised to call back or seek an in-person evaluation if any new concerns, if symptoms worsen or if the condition fails to improve as anticipated.  21 minutes of non-face-to-face time was provided during this encounter.                      Historical information moved to improve visibility of documentation.  Past Medical History:  Diagnosis Date  . Hypertension    Past Surgical History:  Procedure Laterality Date  . TONSILLECTOMY     Social History   Tobacco Use  . Smoking status: Never Smoker  . Smokeless tobacco: Never Used  Substance Use Topics  . Alcohol use: No   family history includes Diabetes in his father; Heart attack in his father.  Medications: Current Outpatient Medications  Medication Sig Dispense Refill  . lisinopril (ZESTRIL) 20 MG tablet Take 1 tablet (20 mg total) by mouth daily. 90 tablet 0  . meloxicam (MOBIC) 15 MG tablet Take 1 tablet (15 mg total) by mouth daily. [place]  tablet 1  . ondansetron (ZOFRAN) 4 MG tablet Take 1 tablet (4 mg total) by mouth every 8 (eight) hours as needed. 30 tablet 1  . traMADol (ULTRAM) 50 MG tablet Take 1 tablet (50 mg total) by mouth every 8 (eight) hours as needed for moderate pain. Maximum 6 tabs per day. 21 tablet 0  . amoxicillin-clavulanate (AUGMENTIN) 875-125 MG tablet Take 1 tablet by mouth 2 (two) times daily. 14 tablet 0   No current facility-administered medications for this visit.    Allergies  Allergen Reactions  . Codeine Rash    PDMP not reviewed this encounter. No orders of the defined types were placed in this encounter.  Meds ordered this encounter  Medications  . lisinopril (ZESTRIL) 20 MG  tablet    Sig: Take 1 tablet (20 mg total) by mouth daily.    Dispense:  90 tablet    Refill:  0  . amoxicillin-clavulanate (AUGMENTIN) 875-125 MG tablet    Sig: Take 1 tablet by mouth 2 (two) times daily.    Dispense:  14 tablet    Refill:  0

## 2019-03-19 ENCOUNTER — Telehealth: Payer: Self-pay | Admitting: Osteopathic Medicine

## 2019-03-19 NOTE — Telephone Encounter (Signed)
Spoke to patient regarding request. Pt will contact the office with the name of cough syrup he is requesting because rx not listed in active med list. Direct call back info provided. No other inquiries during call.

## 2019-03-19 NOTE — Telephone Encounter (Signed)
PT requested cough syrup to be prescribed from the appointment on 03/18/19.  Please advise.

## 2019-03-20 MED ORDER — PROMETHAZINE-DM 6.25-15 MG/5ML PO SYRP: 2.5000 mL | ORAL_SOLUTION | Freq: Four times a day (QID) | ORAL | 0 refills | Status: DC | PRN

## 2019-03-20 NOTE — Addendum Note (Signed)
Addended by: Maryla Morrow on: 03/20/2019 12:31 PM   Modules accepted: Orders

## 2019-03-20 NOTE — Telephone Encounter (Signed)
Patient called after hours line and stated he was needing refill on Promethazine DM. Requesting this be sent to [facility] in [place]

## 2019-03-20 NOTE — Telephone Encounter (Signed)
Pt advised.

## 2019-03-20 NOTE — Telephone Encounter (Signed)
Sent!

## 2019-05-21 ENCOUNTER — Encounter: Payer: Self-pay | Admitting: Osteopathic Medicine

## 2019-05-21 ENCOUNTER — Ambulatory Visit (INDEPENDENT_AMBULATORY_CARE_PROVIDER_SITE_OTHER): Payer: Self-pay | Admitting: Osteopathic Medicine

## 2019-05-21 VITALS — BP 120/87 | HR 86 | Temp 98.0°F | Wt 170.0 lb

## 2019-05-21 DIAGNOSIS — I10 Essential (primary) hypertension: Secondary | ICD-10-CM

## 2019-05-21 DIAGNOSIS — Z0000 Encounter for general adult medical examination without abnormal findings: Secondary | ICD-10-CM

## 2019-05-21 DIAGNOSIS — N529 Male erectile dysfunction, unspecified: Secondary | ICD-10-CM

## 2019-05-21 DIAGNOSIS — N528 Other male erectile dysfunction: Secondary | ICD-10-CM

## 2019-05-21 MED ORDER — SILDENAFIL CITRATE 100 MG PO TABS: 50.0000 mg | ORAL_TABLET | Freq: Every day | ORAL | 11 refills | Status: DC | PRN

## 2019-05-21 NOTE — Patient Instructions (Addendum)
Let's try the Viagra 100 mg tablets, try at half dose 50 mg. If this works can continue with that dose, but if you need to go up to 100 mg that's fine!   If it doesn't seem to be helping, let me know. Sometime switching the medicine is all we need. It might be worth getting labs as well to check a few other things make sure all is well.   You're due for labs anyway! Let's plan on an annual check-up / physical in the next 6 months once COVID hopefully dies down a bit. Call me (Vanicia's direct number is [phone]) or send [place] a message if needed!

## 2019-05-21 NOTE — Progress Notes (Signed)
Virtual Visit via Video (App used: Doximity) Note  I connected with      Joel Matthews on 05/21/19 at 7:25 AM  by a telemedicine application and verified that I am speaking with the correct person using two identifiers.  Patient is at home I am in office   I discussed the limitations of evaluation and management by telemedicine and the availability of in person appointments. The patient expressed understanding and agreed to proceed.  History of Present Illness: Joel Matthews is a 63 y.o. male who would like to discuss erectile problems   New problem. No concerns w/ depression, fatigue, low libido. New girlfriend, needs "something to help out."      Observations/Objective: BP 120/87 (BP Location: Left Arm, Patient Position: Sitting, Cuff Size: Normal)   Pulse 86   Temp 98 F (36.7 C) (Oral)   Wt 170 lb (77.1 kg)   BMI 30.11 kg/m  BP Readings from Last 3 Encounters:  05/21/19 120/87  03/18/19 126/88  03/09/18 134/82   Exam: Normal Speech.  NAD  Lab and Radiology Results No results found for this or any previous visit (from the past 72 hour(s)). No results found.     Assessment and Plan: 63 y.o. male with The primary encounter diagnosis was Erectile dysfunction, unspecified erectile dysfunction type. Diagnoses of Annual physical exam and Essential hypertension, benign were also pertinent to this visit.   PDMP not reviewed this encounter. Orders Placed This Encounter  Procedures  . CBC  . COMPLETE METABOLIC PANEL WITH GFR  . LIPID SCREENING  . TSH  . PSA SOLSTAS   Meds ordered this encounter  Medications  . sildenafil (VIAGRA) 100 MG tablet    Sig: Take 0.5-1 tablets (50-100 mg total) by mouth daily as needed for erectile dysfunction.    Dispense:  10 tablet    Refill:  11   Patient Instructions  Let's try the Viagra 100 mg tablets, try at half dose 50 mg. If this works can continue with that dose, but if you need to go up to 100 mg that's fine!   If it  doesn't seem to be helping, let me know. Sometime switching the medicine is all we need. It might be worth getting labs as well to check a few other things make sure all is well.   You're due for labs anyway! Let's plan on an annual check-up / physical in the next 6 months once COVID hopefully dies down a bit. Call me (Vanicia's direct number is [phone]) or send [place] a message if needed!        Instructions sent via MyChart. If MyChart not available, pt was given option for info via personal e-mail w/ no guarantee of protected health info over unsecured e-mail communication, and MyChart sign-up instructions were sent to patient.   Follow Up Instructions: Return in about 6 months (around 11/19/2019) for [place] (get labs prior to visit, orders are in).    I discussed the assessment and treatment plan with the patient. The patient was provided an opportunity to ask questions and all were answered. The patient agreed with the plan and demonstrated an understanding of the instructions.   The patient was advised to call back or seek an in-person evaluation if any new concerns, if symptoms worsen or if the condition fails to improve as anticipated.  15 minutes of non-face-to-face time was provided during this encounter.      . . . . . . . . . . . . . Marland Kitchen  Historical information moved to improve visibility of documentation.  Past Medical History:  Diagnosis Date  . Hypertension    Past Surgical History:  Procedure Laterality Date  . TONSILLECTOMY     Social History   Tobacco Use  . Smoking status: Never Smoker  . Smokeless tobacco: Never Used  Substance Use Topics  . Alcohol use: No   family history includes Diabetes in his father; Heart attack in his father.  Medications: Current Outpatient Medications  Medication Sig Dispense Refill  . lisinopril (ZESTRIL) 20 MG tablet Take 1 tablet (20 mg total) by mouth daily. 90 tablet 0  .  amoxicillin-clavulanate (AUGMENTIN) 875-125 MG tablet Take 1 tablet by mouth 2 (two) times daily. (Patient not taking: Reported on 05/21/2019) 14 tablet 0  . meloxicam (MOBIC) 15 MG tablet Take 1 tablet (15 mg total) by mouth daily. (Patient not taking: Reported on 05/21/2019) 90 tablet 1  . ondansetron (ZOFRAN) 4 MG tablet Take 1 tablet (4 mg total) by mouth every 8 (eight) hours as needed. (Patient not taking: Reported on 05/21/2019) 30 tablet 1  . promethazine-dextromethorphan (PROMETHAZINE-DM) 6.25-15 MG/5ML syrup Take 2.5 mLs by mouth 4 (four) times daily as needed for cough. (Patient not taking: Reported on 05/21/2019) 180 mL 0  . sildenafil (VIAGRA) 100 MG tablet Take 0.5-1 tablets (50-100 mg total) by mouth daily as needed for erectile dysfunction. 10 tablet 11  . traMADol (ULTRAM) 50 MG tablet Take 1 tablet (50 mg total) by mouth every 8 (eight) hours as needed for moderate pain. Maximum 6 tabs per day. (Patient not taking: Reported on 05/21/2019) 21 tablet 0   No current facility-administered medications for this visit.   Allergies  Allergen Reactions  . Codeine Rash

## 2019-06-24 ENCOUNTER — Encounter: Payer: Self-pay | Admitting: Osteopathic Medicine

## 2019-06-24 ENCOUNTER — Ambulatory Visit (INDEPENDENT_AMBULATORY_CARE_PROVIDER_SITE_OTHER): Payer: Self-pay | Admitting: Osteopathic Medicine

## 2019-06-24 VITALS — BP 120/86 | Temp 98.6°F | Wt 165.0 lb

## 2019-06-24 DIAGNOSIS — N529 Male erectile dysfunction, unspecified: Secondary | ICD-10-CM

## 2019-06-24 MED ORDER — PROMETHAZINE-DM 6.25-15 MG/5ML PO SYRP: 2.5000 mL | ORAL_SOLUTION | Freq: Four times a day (QID) | ORAL | 1 refills | Status: DC | PRN

## 2019-06-24 MED ORDER — SILDENAFIL CITRATE 100 MG PO TABS: 50.0000 mg | ORAL_TABLET | Freq: Every day | ORAL | 5 refills | Status: DC | PRN

## 2019-06-24 NOTE — Progress Notes (Signed)
Virtual Visit via Video (App used: Doximity) Note  I connected with      Joel Matthews on 06/24/19 at 2:09 PM  by a telemedicine application and verified that I am speaking with the correct person using two identifiers.  Patient is in the car I am in office   I discussed the limitations of evaluation and management by telemedicine and the availability of in person appointments. The patient expressed understanding and agreed to proceed.  History of Present Illness: Joel Matthews is a 64 y.o. male who would like to discuss refill cough medication and sildenafil   Cough medication is helping when he needs this may be a few times per week.  Sildenafil 50 mg was not helpful at all, 100 mg helped somewhat, he took 200 mg and this was okay.  Would like testosterone checked.    Observations/Objective: BP 120/86   Temp 98.6 F (37 C) (Oral)   Wt 165 lb (74.8 kg)   BMI 29.23 kg/m  BP Readings from Last 3 Encounters:  06/24/19 120/86  05/21/19 120/87  03/18/19 126/88   Exam: Normal Speech.  NAD  Lab and Radiology Results No results found for this or any previous visit (from the past 72 hour(s)). No results found.     Assessment and Plan: 64 y.o. male with The encounter diagnosis was Erectile dysfunction, unspecified erectile dysfunction type.   PDMP not reviewed this encounter. Orders Placed This Encounter  Procedures  . Testosterone   Meds ordered this encounter  Medications  . promethazine-dextromethorphan (PROMETHAZINE-DM) 6.25-15 MG/5ML syrup    Sig: Take 2.5-5 mLs by mouth 4 (four) times daily as needed for cough.    Dispense:  180 mL    Refill:  1  . sildenafil (VIAGRA) 100 MG tablet    Sig: Take 0.5-1 tablets (50-100 mg total) by mouth daily as needed for erectile dysfunction.    Dispense:  30 tablet    Refill:  5   Testosterone.  Going up to 200 mg sildenafil as needed would definitely be off label use, advised against this at least until we have  testosterone/other labs back   Follow Up Instructions: Return for RECHECK PENDING RESULTS / IF WORSE OR CHANGE.    I discussed the assessment and treatment plan with the patient. The patient was provided an opportunity to ask questions and all were answered. The patient agreed with the plan and demonstrated an understanding of the instructions.   The patient was advised to call back or seek an in-person evaluation if any new concerns, if symptoms worsen or if the condition fails to improve as anticipated.  30 minutes of non-face-to-face time was provided during this encounter.      . . . . . . . . . . . . . Marland Kitchen                   Historical information moved to improve visibility of documentation.  Past Medical History:  Diagnosis Date  . Hypertension    Past Surgical History:  Procedure Laterality Date  . TONSILLECTOMY     Social History   Tobacco Use  . Smoking status: Never Smoker  . Smokeless tobacco: Never Used  Substance Use Topics  . Alcohol use: No   family history includes Diabetes in his father; Heart attack in his father.  Medications: Current Outpatient Medications  Medication Sig Dispense Refill  . lisinopril (ZESTRIL) 20 MG tablet Take 1 tablet (20 mg total) by mouth  daily. 90 tablet 0  . meloxicam (MOBIC) 15 MG tablet Take 1 tablet (15 mg total) by mouth daily. 90 tablet 1  . ondansetron (ZOFRAN) 4 MG tablet Take 1 tablet (4 mg total) by mouth every 8 (eight) hours as needed. 30 tablet 1  . promethazine-dextromethorphan (PROMETHAZINE-DM) 6.25-15 MG/5ML syrup Take 2.5-5 mLs by mouth 4 (four) times daily as needed for cough. 180 mL 1  . sildenafil (VIAGRA) 100 MG tablet Take 0.5-1 tablets (50-100 mg total) by mouth daily as needed for erectile dysfunction. 30 tablet 5  . traMADol (ULTRAM) 50 MG tablet Take 1 tablet (50 mg total) by mouth every 8 (eight) hours as needed for moderate pain. Maximum 6 tabs per day. 21 tablet 0   No  current facility-administered medications for this visit.   Allergies  Allergen Reactions  . Codeine Rash

## 2019-06-25 ENCOUNTER — Ambulatory Visit: Payer: Self-pay | Admitting: Osteopathic Medicine

## 2019-08-09 ENCOUNTER — Telehealth: Payer: Self-pay

## 2019-08-09 DIAGNOSIS — N529 Male erectile dysfunction, unspecified: Secondary | ICD-10-CM

## 2019-08-09 NOTE — Telephone Encounter (Signed)
Pt called stating that sildenafil does not work. States that 1/2 didn't have any effect. He went up to 1 tab as directed. Mentioned that 1 tab didn't help either so he ended up taking another tablet. Pt mentioned that was the worst idea and had some side effects. He wants to know if maybe he should switch to brand only. Also wanted to know if provider heard of wave technology? Apparently it's a procedure to help with ED without the use of medications. Pls advise, thanks.

## 2019-08-13 MED ORDER — TADALAFIL 20 MG PO TABS: 10.0000 mg | ORAL_TABLET | Freq: Every day | ORAL | 11 refills | Status: DC | PRN

## 2019-08-13 NOTE — Telephone Encounter (Signed)
Can try Cialis/tadalafil instead - Rx sent For other therapies, I can refer to urology - I'm not as familiar with the more specialized treatment

## 2019-08-14 NOTE — Telephone Encounter (Signed)
OK referral sent 

## 2019-08-14 NOTE — Telephone Encounter (Signed)
Contacted pt regarding Cialis Rx. He will try the new Rx.   Requested referral to Urology for ED and he has a new sx. Currently, unable to urinate without defecating 1st. Pt states the reverse used to be true when he needed to defecate.

## 2019-08-17 ENCOUNTER — Other Ambulatory Visit: Payer: Self-pay | Admitting: Osteopathic Medicine

## 2019-08-17 DIAGNOSIS — I10 Essential (primary) hypertension: Secondary | ICD-10-CM

## 2019-08-28 NOTE — Telephone Encounter (Signed)
Noted. Task completed. Left a vm msg for pt regarding the referral on 08/20/19. Direct call back info provided.

## 2019-09-24 DIAGNOSIS — R972 Elevated prostate specific antigen [PSA]: Secondary | ICD-10-CM | POA: Diagnosis not present

## 2019-09-24 DIAGNOSIS — N5201 Erectile dysfunction due to arterial insufficiency: Secondary | ICD-10-CM | POA: Diagnosis not present

## 2019-09-24 DIAGNOSIS — Z125 Encounter for screening for malignant neoplasm of prostate: Secondary | ICD-10-CM | POA: Diagnosis not present

## 2019-12-06 ENCOUNTER — Ambulatory Visit (INDEPENDENT_AMBULATORY_CARE_PROVIDER_SITE_OTHER): Payer: BC Managed Care – PPO | Admitting: Osteopathic Medicine

## 2019-12-06 ENCOUNTER — Encounter: Payer: Self-pay | Admitting: Osteopathic Medicine

## 2019-12-06 ENCOUNTER — Other Ambulatory Visit: Payer: Self-pay

## 2019-12-06 DIAGNOSIS — I10 Essential (primary) hypertension: Secondary | ICD-10-CM

## 2019-12-06 MED ORDER — LISINOPRIL 20 MG PO TABS: 20.0000 mg | ORAL_TABLET | Freq: Every day | ORAL | 3 refills | Status: DC

## 2019-12-06 NOTE — Progress Notes (Signed)
Joel Matthews is a 63 y.o. male who presents to  [facility] at [facility]  today, 12/06/19, seeking care for the following:  . 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 BP here is at goal! Home monitors differ quite a bit. . RRR, normal S1S2, CTABL      ASSESSMENT & PLAN with other pertinent findings:  The encounter diagnosis was Essential hypertension.   RTC for home BP monitor verification If alternate Dr for DOT clearance desired, could try [facility] downstairs     Meds ordered this encounter  Medications  . lisinopril (ZESTRIL) 20 MG tablet    Sig: Take 1 tablet (20 mg total) by mouth daily.    Dispense:  90 tablet    Refill:  3       Follow-up instructions: Return for needs labs! .                                         BP 123/79 (BP Location: Left Arm, Patient Position: Sitting)   Pulse 69   Temp 98 F (36.7 C)   Wt 180 lb (81.6 kg)   SpO2 98%   BMI 31.89 kg/m   Current Meds  Medication Sig  . lisinopril (ZESTRIL) 20 MG tablet Take 1 tablet (20 mg total) by mouth daily.  . [DISCONTINUED] lisinopril (ZESTRIL) 20 MG tablet Take 1 tablet by mouth once daily    No results found for this or any previous visit (from the past 72 hour(s)).  No results found.     All questions at time of visit were answered - patient instructed to contact office with any additional concerns or updates.  ER/RTC precautions were reviewed with the patient as applicable.   Please note: voice recognition software was used to produce this document, and typos may escape review. Please contact Dr. Lyn Hollingshead for any needed clarifications.   Total encounter time: 30 minutes.

## 2021-01-04 ENCOUNTER — Other Ambulatory Visit: Payer: Self-pay | Admitting: *Deleted

## 2021-01-04 ENCOUNTER — Other Ambulatory Visit: Payer: Self-pay | Admitting: Osteopathic Medicine

## 2021-01-04 ENCOUNTER — Telehealth: Payer: Self-pay | Admitting: *Deleted

## 2021-01-04 DIAGNOSIS — I10 Essential (primary) hypertension: Secondary | ICD-10-CM

## 2021-01-04 MED ORDER — LISINOPRIL 20 MG PO TABS: 20.0000 mg | ORAL_TABLET | Freq: Every day | ORAL | 0 refills | Status: DC

## 2021-01-04 NOTE — Telephone Encounter (Signed)
Refilled Lisinopril for 30 days.  Please call pt and schedule an appt for HTN.

## 2021-01-04 NOTE — Telephone Encounter (Signed)
Pt scheduled for a follow up, he asked to be seen sooner because dose was not helping BP

## 2021-01-07 ENCOUNTER — Other Ambulatory Visit: Payer: Self-pay

## 2021-01-07 ENCOUNTER — Ambulatory Visit: Payer: BC Managed Care – PPO | Admitting: Osteopathic Medicine

## 2021-01-07 ENCOUNTER — Ambulatory Visit (INDEPENDENT_AMBULATORY_CARE_PROVIDER_SITE_OTHER): Payer: BC Managed Care – PPO | Admitting: Osteopathic Medicine

## 2021-01-07 VITALS — BP 123/76 | HR 70 | Temp 98.0°F | Wt 186.0 lb

## 2021-01-07 DIAGNOSIS — Z131 Encounter for screening for diabetes mellitus: Secondary | ICD-10-CM | POA: Diagnosis not present

## 2021-01-07 DIAGNOSIS — Z125 Encounter for screening for malignant neoplasm of prostate: Secondary | ICD-10-CM | POA: Diagnosis not present

## 2021-01-07 DIAGNOSIS — Z0000 Encounter for general adult medical examination without abnormal findings: Secondary | ICD-10-CM | POA: Diagnosis not present

## 2021-01-07 DIAGNOSIS — I10 Essential (primary) hypertension: Secondary | ICD-10-CM | POA: Diagnosis not present

## 2021-01-07 DIAGNOSIS — Z13220 Encounter for screening for lipoid disorders: Secondary | ICD-10-CM

## 2021-01-07 MED ORDER — VALSARTAN-HYDROCHLOROTHIAZIDE 160-25 MG PO TABS: 1.0000 | ORAL_TABLET | Freq: Every day | ORAL | 0 refills | Status: DC

## 2021-01-07 NOTE — Progress Notes (Signed)
HPI: Joel Matthews is a 65 y.o. male who  has a past medical history of Hypertension.  he presents to [facility] today, 01/07/21,  for chief complaint of:  HYPERTENSION FOLLOW-UP  Last seen >1 year ago. He has been taking 2, occasionally 3 of his lisinopril 20 mg, reports this is helping his BP (measuring at home) and occasional headaches.    BP Readings from Last 3 Encounters:  01/07/21 123/76  12/06/19 123/79  06/24/19 120/86        ASSESSMENT/PLAN: The primary encounter diagnosis was Annual physical exam. Diagnoses of Essential hypertension, Prostate cancer screening, Diabetes mellitus screening, and Lipid screening were also pertinent to this visit.  1. Annual physical exam See below  2. Essential hypertension STOP lisinopril SART valsartan-HCT Labs today - if ok, and home BP at goal, can refill meds for a year   3. Prostate cancer screening 4. Diabetes mellitus screening 5. Lipid screening Labs pending    Orders Placed This Encounter  Procedures   CBC   COMPLETE METABOLIC PANEL WITH GFR   Lipid panel   Hemoglobin A1c   PSA, Total with Reflex to PSA, Free   TSH      Meds ordered this encounter  Medications   valsartan-hydrochlorothiazide (DIOVAN-HCT) 160-25 MG tablet    Sig: Take 1 tablet by mouth daily.    Dispense:  90 tablet    Refill:  0     Patient Instructions  General Preventive Care Most recent routine screening labs: ordered today!  Blood pressure goal 130/80 or less.  Tobacco: don't!  Alcohol: responsible moderation is ok for most adults - if you have concerns about your alcohol intake, please talk to me!  Exercise: as tolerated to reduce risk of cardiovascular disease and diabetes. Strength training will also prevent osteoporosis.  Mental health: if need for mental health care (medicines, counseling, other), or concerns about moods, please let me know!  Sexual / Reproductive health: if need for STD  testing, or if concerns with libido/pain problems, please let me know!  Advanced Directive: Living Will and/or Healthcare Power of Attorney recommended for all adults, regardless of age or health.  Vaccines Flu vaccine: for almost everyone, every fall.  Shingles vaccine: all done!  Pneumonia vaccines: recommended at age 92! Tetanus booster: every 10 years - due 04/2026 COVID vaccine: STRONGLY RECOMMENDED  Cancer screenings  Colon cancer screening: coloscopy per GI  Prostate cancer screening: PSA blood test age 21-71 Lung cancer screening: CT chest every year for those aged 31 to 2 years who have a 20 pack-year smoking history and currently smoke or have quit within the past 15 years  Infection screenings  HIV: recommended screening at least once age 20-65, more often as needed. Gonorrhea/Chlamydia: screening as needed Hepatitis C: recommended once for everyone age 57-75 TB: certain at-risk populations, or depending on work requirements and/or travel history Other Bone Density Test: recommended for men at age 66 Abdominal Aortic Aneurysm: screening with ultrasound recommended once for men age 19-75 who have ever smoked 100+ cigarettes (lifetime)              FYI to my patients: After six years here, I will be leaving practice at [facility]. My last day here will be 03/05/2021. I will continue to provide your care up until that date, and you will still be considered a patient here after that as long as you want to be!    You will get  a letter in the mail explaining details, but after 03/05/2021, my patients have several options to continue care:  1) you can establish care with Dr. Everrett Coombe or Christen Butter NP, who are accepting new patients here and are absorbing many folks from my current patient panel, OR...  2) you can see any available provider here on as-needed basis until my official replacement starts (hiring a new doctor has not been  finalized yet), OR.Marland KitchenMarland Kitchen 3) if you choose to seek care elsewhere, this office will be happy to facilitate transfer of records, and will refill medications on a case-by-case basis.   It is bittersweet to leave! 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 I have truly enjoyed taking care of folks here, but I am also excited for my next adventure doing something a bit different. Take care, and please let us know if you have any questions!   -Dr. Mervyn Skeeters.          Follow-up plan: Return in about 1 year (around 01/07/2022) for ANNUAL CHECK-UP AS LONG AS LABS/BP OK - SEE [place] SOONER IF NEEDED.                                                 ################################################# ################################################# ################################################# #################################################    Current Meds  Medication Sig   valsartan-hydrochlorothiazide (DIOVAN-HCT) 160-25 MG tablet Take 1 tablet by mouth daily.    Allergies  Allergen Reactions   Codeine Rash       Review of Systems: Pertinent (+) and (-) ROS in HPI as above   Exam:  BP 123/76 (BP Location: Left Arm, Patient Position: Sitting, Cuff Size: Large)   Pulse 70   Temp 98 F (36.7 C) (Oral)   Wt 186 lb (84.4 kg)   BMI 32.95 kg/m  Constitutional: VS see above. General Appearance: alert, well-developed, well-nourished, NAD Neck: No masses, trachea midline.  Respiratory: Normal respiratory effort. no wheeze, no rhonchi, no rales Cardiovascular: S1/S2 normal, no murmur, no rub/gallop auscultated. RRR.  Musculoskeletal: Gait normal. Symmetric and independent movement of all extremities Abdominal: non-tender, non-distended, no appreciable organomegaly, neg Murphy's, BS WNLx4 Neurological: Normal balance/coordination. No  tremor. Skin: warm, dry, intact.  Psychiatric: Normal judgment/insight. Normal mood and affect. Oriented x3.       Visit summary with medication list and pertinent instructions was printed for patient to review, patient was advised to alert [place] if any updates are needed. All questions at time of visit were answered - patient instructed to contact office with any additional concerns. ER/RTC precautions were reviewed with the patient and understanding verbalized.      Please note: voice recognition software was used to produce this document, and typos may escape review. Please contact Dr. Lyn Hollingshead for any needed clarifications.    Follow up plan: Return in about 1 year (around 01/07/2022) for ANNUAL CHECK-UP AS LONG AS LABS/BP OK - SEE [place] SOONER IF NEEDED.

## 2021-01-07 NOTE — Addendum Note (Signed)
Addended by: Deirdre Pippins on: 01/07/2021 02:44 PM   Modules accepted: Orders

## 2021-01-07 NOTE — Patient Instructions (Addendum)
General Preventive Care Most recent routine screening labs: ordered today!  Blood pressure goal 130/80 or less.  Tobacco: don't!  Alcohol: responsible moderation is ok for most adults - if you have concerns about your alcohol intake, please talk to me!  Exercise: as tolerated to reduce risk of cardiovascular disease and diabetes. Strength training will also prevent osteoporosis.  Mental health: if need for mental health care (medicines, counseling, other), or concerns about moods, please let me know!  Sexual / Reproductive health: if need for STD testing, or if concerns with libido/pain problems, please let me know!  Advanced Directive: Living Will and/or Healthcare Power of Attorney recommended for all adults, regardless of age or health.  Vaccines Flu vaccine: for almost everyone, every fall.  Shingles vaccine: all done!  Pneumonia vaccines: recommended at age 64! Tetanus booster: every 10 years - due 04/2026 COVID vaccine: STRONGLY RECOMMENDED  Cancer screenings  Colon cancer screening: coloscopy per GI  Prostate cancer screening: PSA blood test age 40-71 Lung cancer screening: CT chest every year for those aged 19 to 35 years who have a 20 pack-year smoking history and currently smoke or have quit within the past 15 years  Infection screenings  HIV: recommended screening at least once age 49-65, more often as needed. Gonorrhea/Chlamydia: screening as needed Hepatitis C: recommended once for everyone age 64-75 TB: certain at-risk populations, or depending on work requirements and/or travel history Other Bone Density Test: recommended for men at age 43 Abdominal Aortic Aneurysm: screening with ultrasound recommended once for men age 30-75 who have ever smoked 100+ cigarettes (lifetime)              FYI to my patients: After six years here, I will be leaving practice at [facility]. My last day here will be 03/05/2021. I will continue to provide  your care up until that date, and you will still be considered a patient here after that as long as you want to be!    You will get a letter in the mail explaining details, but after 03/05/2021, my patients have several options to continue care:  1) you can establish care with Dr. Everrett Coombe or Christen Butter NP, who are accepting new patients here and are absorbing many folks from my current patient panel, OR...  2) you can see any available provider here on as-needed basis until my official replacement starts (hiring a new doctor has not been finalized yet), OR.Marland KitchenMarland Kitchen 3) if you choose to seek care elsewhere, this office will be happy to facilitate transfer of records, and will refill medications on a case-by-case basis.   It is bittersweet to leave! 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 I have truly enjoyed taking care of folks here, but I am also excited for my next adventure doing something a bit different. Take care, and please let us know if you have any questions!   -Dr. Mervyn Skeeters.

## 2021-01-08 LAB — LIPID PANEL
Cholesterol: 256 mg/dL — ABNORMAL HIGH (ref <200)
HDL: 48 mg/dL (ref >=40)
LDL Cholesterol (Calc): 176 mg/dL (calc) — ABNORMAL HIGH
Non-HDL Cholesterol (Calc): 208 mg/dL (calc) — ABNORMAL HIGH (ref <130)
Total CHOL/HDL Ratio: 5.3 (calc) — ABNORMAL HIGH (ref <5.0)
Triglycerides: 170 mg/dL — ABNORMAL HIGH (ref <150)

## 2021-01-08 LAB — CBC
HCT: 47.8 % (ref 38.5–50.0)
Hemoglobin: 15.8 g/dL (ref 13.2–17.1)
MCH: 29 pg (ref 27.0–33.0)
MCHC: 33.1 g/dL (ref 32.0–36.0)
MCV: 87.7 fL (ref 80.0–100.0)
MPV: 9.6 fL (ref 7.5–12.5)
Platelets: 243 10*3/uL (ref 140–400)
RBC: 5.45 10*6/uL (ref 4.20–5.80)
RDW: 13.4 % (ref 11.0–15.0)
WBC: 4.1 10*3/uL (ref 3.8–10.8)

## 2021-01-08 LAB — HEMOGLOBIN A1C
Hgb A1c MFr Bld: 5.7 % of total Hgb — ABNORMAL HIGH (ref <5.7)
Mean Plasma Glucose: 117 mg/dL
eAG (mmol/L): 6.5 mmol/L

## 2021-01-08 LAB — COMPLETE METABOLIC PANEL WITH GFR
AG Ratio: 1.7 (calc) (ref 1.0–2.5)
ALT: 15 U/L (ref 9–46)
AST: 16 U/L (ref 10–35)
Albumin: 4.3 g/dL (ref 3.6–5.1)
Alkaline phosphatase (APISO): 81 U/L (ref 35–144)
BUN: 20 mg/dL (ref 7–25)
CO2: 24 mmol/L (ref 20–32)
Calcium: 9.2 mg/dL (ref 8.6–10.3)
Chloride: 106 mmol/L (ref 98–110)
Creat: 1 mg/dL (ref 0.70–1.35)
Globulin: 2.5 g/dL (calc) (ref 1.9–3.7)
Glucose, Bld: 101 mg/dL (ref 65–139)
Potassium: 4.2 mmol/L (ref 3.5–5.3)
Sodium: 138 mmol/L (ref 135–146)
Total Bilirubin: 1.3 mg/dL — ABNORMAL HIGH (ref 0.2–1.2)
Total Protein: 6.8 g/dL (ref 6.1–8.1)
eGFR: 84 mL/min/{1.73_m2} (ref >=60)

## 2021-01-08 LAB — REFLEX PSA, FREE
PSA, % Free: 18 % (calc) — ABNORMAL LOW (ref >25)
PSA, Free: 1.4 ng/mL

## 2021-01-08 LAB — PSA, TOTAL WITH REFLEX TO PSA, FREE: PSA, Total: 7.7 ng/mL — ABNORMAL HIGH (ref <=4.0)

## 2021-01-08 LAB — TSH: TSH: 1.86 mIU/L (ref 0.40–4.50)

## 2021-02-11 ENCOUNTER — Other Ambulatory Visit: Payer: Self-pay | Admitting: Osteopathic Medicine

## 2021-02-11 DIAGNOSIS — I10 Essential (primary) hypertension: Secondary | ICD-10-CM

## 2021-07-06 ENCOUNTER — Other Ambulatory Visit: Payer: Self-pay | Admitting: Osteopathic Medicine

## 2022-05-06 ENCOUNTER — Encounter: Payer: Self-pay | Admitting: Family Medicine

## 2022-05-06 ENCOUNTER — Ambulatory Visit (INDEPENDENT_AMBULATORY_CARE_PROVIDER_SITE_OTHER): Payer: 59 | Admitting: Family Medicine

## 2022-05-06 VITALS — BP 139/75 | HR 74 | Temp 97.9°F | Ht 65.0 in | Wt 185.1 lb

## 2022-05-06 DIAGNOSIS — I10 Essential (primary) hypertension: Secondary | ICD-10-CM

## 2022-05-06 DIAGNOSIS — Z13220 Encounter for screening for lipoid disorders: Secondary | ICD-10-CM | POA: Diagnosis not present

## 2022-05-06 DIAGNOSIS — Z7689 Persons encountering health services in other specified circumstances: Secondary | ICD-10-CM

## 2022-05-06 DIAGNOSIS — Z136 Encounter for screening for cardiovascular disorders: Secondary | ICD-10-CM

## 2022-05-06 DIAGNOSIS — R7302 Impaired glucose tolerance (oral): Secondary | ICD-10-CM | POA: Diagnosis not present

## 2022-05-06 DIAGNOSIS — Z1159 Encounter for screening for other viral diseases: Secondary | ICD-10-CM

## 2022-05-06 DIAGNOSIS — Z0000 Encounter for general adult medical examination without abnormal findings: Secondary | ICD-10-CM

## 2022-05-06 DIAGNOSIS — Z125 Encounter for screening for malignant neoplasm of prostate: Secondary | ICD-10-CM

## 2022-05-06 MED ORDER — VALSARTAN-HYDROCHLOROTHIAZIDE 160-25 MG PO TABS: 1.0000 | ORAL_TABLET | Freq: Every day | ORAL | 3 refills | Status: AC

## 2022-05-06 NOTE — Progress Notes (Signed)
New Patient Office Visit  Subjective    Patient ID: Joel Matthews, male    DOB: 23-Jan-1956  Age: 66 y.o. MRN: [id number]  CC:  Chief Complaint  Patient presents with   New Patient (Initial Visit)    Patient in office to est PCP - here for HTN  check up and rx rf   Hypertension    HPI Joel Matthews presents to establish care and physical care.   He is taking Diovan-HCT for HTN. Been out of this for a few months. He reports going on a herb diet but it was too expensive. He stopped taking this.  No other medicines. Needs this refilled.  No surgeries.  Will start work with [employer] next week.  Up to date with colonoscopy. Done 2022.  Outpatient Encounter Medications as of 05/06/2022  Medication Sig   valsartan-hydrochlorothiazide (DIOVAN-HCT) 160-25 MG tablet Take 1 tablet by mouth daily. NO REFILLS. NEEDS TO TRANSFER CARE TO NEW PCP.   [DISCONTINUED] gabapentin (NEURONTIN) 100 MG capsule Take 1 po at HS, increase up to 3 po at HS as tolerated (Patient not taking: Reported on 12/06/2019)   No facility-administered encounter medications on file as of 05/06/2022.    Past Medical History:  Diagnosis Date   Hypertension     Past Surgical History:  Procedure Laterality Date   TONSILLECTOMY      Family History  Problem Relation Age of Onset   Cancer Mother 49   Heart attack Father 8   Diabetes Father    Lymphoma Father     Social History   Socioeconomic History   Marital status: Single    Spouse name: Not on file   Number of children: Not on file   Years of education: Not on file   Highest education level: Not on file  Occupational History   Occupation: [employer]  Tobacco Use   Smoking status: Never   Smokeless tobacco: Never  Substance and Sexual Activity   Alcohol use: No   Drug use: No   Sexual activity: Not Currently  Other Topics Concern   Not on file  Social History Narrative   Enjoys Fishing   Has 2 boxer/pit mix 6 month and 6 y.o dogs   Social Determinants  of Health   Financial Resource Strain: Not on file  Food Insecurity: Not on file  Transportation Needs: Not on file  Physical Activity: Not on file  Stress: Not on file  Social Connections: Not on file  Intimate Partner Violence: Not on file    Review of Systems  All other systems reviewed and are negative.     Objective    BP 139/75   Pulse 74   Temp 97.9 F (36.6 C)   Ht 5\' 5"  (1.651 m)   Wt 185 lb 1 oz (83.9 kg)   SpO2 98%   BMI 30.80 kg/m   Physical Exam Vitals and nursing note reviewed.  Constitutional:      Appearance: Normal appearance. He is normal weight.  HENT:     Head: Normocephalic and atraumatic.     Right Ear: Tympanic membrane, ear canal and external ear normal.     Left Ear: Tympanic membrane, ear canal and external ear normal.     Nose: Nose normal.     Mouth/Throat:     Mouth: Mucous membranes are moist.     Pharynx: Oropharynx is clear.  Eyes:     Conjunctiva/sclera: Conjunctivae normal.     Pupils: Pupils are equal, round,  and reactive to light.  Cardiovascular:     Rate and Rhythm: Normal rate and regular rhythm.  Pulmonary:     Effort: Pulmonary effort is normal.     Breath sounds: Normal breath sounds.  Abdominal:     General: Abdomen is flat. Bowel sounds are normal.  Musculoskeletal:     Cervical back: Normal range of motion and neck supple.  Skin:    General: Skin is warm.     Capillary Refill: Capillary refill takes less than 2 seconds.  Neurological:     General: No focal deficit present.     Mental Status: He is alert and oriented to person, place, and time. Mental status is at baseline.  Psychiatric:        Mood and Affect: Mood normal.        Behavior: Behavior normal.        Thought Content: Thought content normal.        Judgment: Judgment normal.      Assessment & Plan:   Problem List Items Addressed This Visit   None Visit Diagnoses     Annual physical exam    -  Primary   Encounter to establish care with new  doctor       Primary hypertension       Impaired glucose tolerance       Relevant Orders   CBC with Differential/Platelet   Comprehensive metabolic panel   Hemoglobin A1c   Encounter for lipid screening for cardiovascular disease       Relevant Orders   Lipid panel   Need for hepatitis C screening test       Relevant Orders   Hepatitis C antibody   Screening PSA (prostate specific antigen)       Relevant Orders   PSA     Screening labs Refilled HTN medicine See in 1 year sooner prn  No follow-ups on file.   Leeanne Rio, MD

## 2022-05-07 LAB — CBC WITH DIFFERENTIAL/PLATELET
Basophils Absolute: 0 10*3/uL (ref 0.0–0.2)
Basos: 1 %
EOS (ABSOLUTE): 0.4 10*3/uL (ref 0.0–0.4)
Eos: 8 %
Hematocrit: 47.7 % (ref 37.5–51.0)
Hemoglobin: 15.9 g/dL (ref 13.0–17.7)
Immature Grans (Abs): 0 10*3/uL (ref 0.0–0.1)
Immature Granulocytes: 0 %
Lymphocytes Absolute: 1.4 10*3/uL (ref 0.7–3.1)
Lymphs: 25 %
MCH: 29.1 pg (ref 26.6–33.0)
MCHC: 33.3 g/dL (ref 31.5–35.7)
MCV: 87 fL (ref 79–97)
Monocytes Absolute: 0.6 10*3/uL (ref 0.1–0.9)
Monocytes: 11 %
Neutrophils Absolute: 2.9 10*3/uL (ref 1.4–7.0)
Neutrophils: 55 %
Platelets: 260 10*3/uL (ref 150–450)
RBC: 5.46 x10E6/uL (ref 4.14–5.80)
RDW: 13.1 % (ref 11.6–15.4)
WBC: 5.3 10*3/uL (ref 3.4–10.8)

## 2022-05-07 LAB — COMPREHENSIVE METABOLIC PANEL
ALT: 25 IU/L (ref 0–44)
AST: 18 IU/L (ref 0–40)
Albumin/Globulin Ratio: 2 (ref 1.2–2.2)
Albumin: 4.5 g/dL (ref 3.9–4.9)
Alkaline Phosphatase: 108 IU/L (ref 44–121)
BUN/Creatinine Ratio: 14 (ref 10–24)
BUN: 14 mg/dL (ref 8–27)
Bilirubin Total: 1.2 mg/dL (ref 0.0–1.2)
CO2: 21 mmol/L (ref 20–29)
Calcium: 9.5 mg/dL (ref 8.6–10.2)
Chloride: 104 mmol/L (ref 96–106)
Creatinine, Ser: 1.03 mg/dL (ref 0.76–1.27)
Globulin, Total: 2.3 g/dL (ref 1.5–4.5)
Glucose: 97 mg/dL (ref 70–99)
Potassium: 4.4 mmol/L (ref 3.5–5.2)
Sodium: 142 mmol/L (ref 134–144)
Total Protein: 6.8 g/dL (ref 6.0–8.5)
eGFR: 80 mL/min/{1.73_m2} (ref >59)

## 2022-05-07 LAB — LIPID PANEL
Chol/HDL Ratio: 6.2 ratio — ABNORMAL HIGH (ref 0.0–5.0)
Cholesterol, Total: 296 mg/dL — ABNORMAL HIGH (ref 100–199)
HDL: 48 mg/dL (ref >39)
LDL Chol Calc (NIH): 211 mg/dL — ABNORMAL HIGH (ref 0–99)
Triglycerides: 191 mg/dL — ABNORMAL HIGH (ref 0–149)
VLDL Cholesterol Cal: 37 mg/dL (ref 5–40)

## 2022-05-07 LAB — HEMOGLOBIN A1C
Est. average glucose Bld gHb Est-mCnc: 120 mg/dL
Hgb A1c MFr Bld: 5.8 % — ABNORMAL HIGH (ref 4.8–5.6)

## 2022-05-07 LAB — HEPATITIS C ANTIBODY: Hep C Virus Ab: NONREACTIVE

## 2022-05-07 LAB — PSA: Prostate Specific Ag, Serum: 9.5 ng/mL — ABNORMAL HIGH (ref 0.0–4.0)

## 2022-05-09 ENCOUNTER — Other Ambulatory Visit: Payer: Self-pay | Admitting: Family Medicine

## 2022-05-09 DIAGNOSIS — R972 Elevated prostate specific antigen [PSA]: Secondary | ICD-10-CM

## 2022-05-09 DIAGNOSIS — E782 Mixed hyperlipidemia: Secondary | ICD-10-CM

## 2022-05-09 MED ORDER — ATORVASTATIN CALCIUM 10 MG PO TABS: 10.0000 mg | ORAL_TABLET | Freq: Every evening | ORAL | 3 refills | Status: DC

## 2022-05-20 ENCOUNTER — Telehealth: Payer: Self-pay | Admitting: Family Medicine

## 2022-05-20 NOTE — Telephone Encounter (Signed)
Removed the statin from his medication list and added to allergy list

## 2022-05-20 NOTE — Addendum Note (Signed)
Addended by: Suzan Slick on: 05/20/2022 09:40 AM   Modules accepted: Orders

## 2022-05-20 NOTE — Telephone Encounter (Signed)
Attemtped call to patient. Left voice mail requesting a return call.  

## 2022-05-20 NOTE — Telephone Encounter (Signed)
The statin medications can cause muscle aches in some patients. Sometimes this is dose dependent. Because the medicine would benefit him and decrease risk of cardiovascular events, could he try cutting in half and taking half a tab? If the half tab dose still causes pain, he may stop it, inform me of this and I'll remove this from his medication list.

## 2022-05-20 NOTE — Telephone Encounter (Signed)
Patient informed. 

## 2022-05-20 NOTE — Telephone Encounter (Signed)
Patient called medication reaction he stated he is feeling all pain the medication is Valsartan 25 mg having major side effects wants a callback or to find out what else could be taken at this time.

## 2022-05-20 NOTE — Telephone Encounter (Signed)
He may stop the Atorvastatin He can try OTC fish oil, omega 3 supplements or red yeast rice.

## 2022-09-07 ENCOUNTER — Ambulatory Visit (INDEPENDENT_AMBULATORY_CARE_PROVIDER_SITE_OTHER): Payer: Self-pay | Admitting: Family Medicine

## 2022-09-07 ENCOUNTER — Encounter: Payer: Self-pay | Admitting: Family Medicine

## 2022-09-07 VITALS — BP 130/84 | HR 76 | Temp 98.1°F | Ht 65.0 in | Wt 185.4 lb

## 2022-09-07 DIAGNOSIS — G8929 Other chronic pain: Secondary | ICD-10-CM | POA: Insufficient documentation

## 2022-09-07 DIAGNOSIS — M25552 Pain in left hip: Secondary | ICD-10-CM

## 2022-09-07 MED ORDER — METHYLPREDNISOLONE 4 MG PO TBPK: ORAL_TABLET | ORAL | 0 refills | Status: AC

## 2022-09-07 NOTE — Patient Instructions (Signed)
Prednisone warning: Prednisone is an anti-inflammatory medication. It is best to only use it for short periods of time. It can cause  your blood pressure to increase and it can cause your blood sugar to increase.

## 2022-09-07 NOTE — Progress Notes (Signed)
   Established Patient Office Visit  Subjective   Patient ID: Mahamed Lukose, male    DOB: 27-May-1956  Age: 67 y.o. MRN: [id number]  Chief Complaint  Patient presents with   Hip Pain    Located on the Left side, and moves around. Started months ago. 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 He states that he has been living off of Aleve.     HPI Presents today for an acute visit with complaint of left hip pain, denies injury, has gotten worse recently.  Symptoms have been present  6-8 months Associated symptoms include: pain radiates down left hip Pertinent negatives: no fever or chills, no procedures on back, no history of cancer, no saddle paresthesia, no changes in bowel or bladder.  Pain severity: 9/10 Treatments tried include : aleve 4 tablets every other day Treatment effective : helped with pain  Chart review. Labs on 05/06/22 per PCP. Kidney function normal.    Review of Systems  Constitutional:  Negative for chills and fever.  Musculoskeletal:  Positive for joint pain. Negative for falls.      Objective:     BP 130/84   Pulse 76   Temp 98.1 F (36.7 C) (Oral)   Ht 5\' 5"  (1.651 m)   Wt 185 lb 6.4 oz (84.1 kg)   SpO2 97%   BMI 30.85 kg/m    Physical Exam Vitals and nursing note reviewed.  Constitutional:      Appearance: Normal appearance. He is obese.  Pulmonary:     Effort: Pulmonary effort is normal.  Musculoskeletal:     Comments: Normal gait. Normal coordination. ROM normal. 5/5 upper and lower extremity strength. Negative straight leg raise. No bony spinous process tenderness. No tenderness in left hip (just took aleve) No loss of sensation. No rash.    Skin:    General: Skin is warm and dry.     Capillary Refill: Capillary refill takes less than 2 seconds.  Neurological:     General: No focal deficit present.     Mental Status: He is alert. Mental status is at baseline.  Psychiatric:        Mood and Affect: Mood normal.         Behavior: Behavior normal.        Thought Content: Thought content normal.        Judgment: Judgment normal.     No results found for any visits on 09/07/22.    The 10-year ASCVD risk score (Arnett DK, et al., 2019) is: 22.4%    Assessment & Plan:   Problem List Items Addressed This Visit     Hip pain, chronic, left - Primary Pain present for 6-8 months, getting worse Normal gait. Normal coordination. ROM normal. 5/5 upper and lower extremity strength. Negative straight leg raise. No bony spinous process tenderness. No tenderness in left hip (just took aleve) No loss of sensation. No rash.  Medrol dose pack for the inflammation and pain control. Referral sent for sports medicine for further evaluation and treatment.    Relevant Medications   methylPREDNISolone (MEDROL DOSEPAK) 4 MG TBPK tablet   Other Relevant Orders   Ambulatory referral to Sports Medicine  Agrees with plan of care discussed.  Questions answered.   Return if symptoms worsen or fail to improve.    Chalmers Guest, FNP
